# Patient Record
Sex: Male | Born: 1945
Health system: Southern US, Community
[De-identification: ages and names within clinical notes are randomized; demographics above are authoritative.]

## PROBLEM LIST (undated history)

## (undated) DIAGNOSIS — T4145XA Adverse effect of unspecified anesthetic, initial encounter: Secondary | ICD-10-CM

## (undated) DIAGNOSIS — I70213 Atherosclerosis of native arteries of extremities with intermittent claudication, bilateral legs: Secondary | ICD-10-CM

## (undated) DIAGNOSIS — K219 Gastro-esophageal reflux disease without esophagitis: Secondary | ICD-10-CM

## (undated) DIAGNOSIS — I2510 Atherosclerotic heart disease of native coronary artery without angina pectoris: Secondary | ICD-10-CM

## (undated) DIAGNOSIS — T8859XA Other complications of anesthesia, initial encounter: Secondary | ICD-10-CM

## (undated) DIAGNOSIS — Z720 Tobacco use: Secondary | ICD-10-CM

## (undated) DIAGNOSIS — I420 Dilated cardiomyopathy: Secondary | ICD-10-CM

## (undated) DIAGNOSIS — I209 Angina pectoris, unspecified: Secondary | ICD-10-CM

## (undated) DIAGNOSIS — J189 Pneumonia, unspecified organism: Secondary | ICD-10-CM

## (undated) DIAGNOSIS — I10 Essential (primary) hypertension: Secondary | ICD-10-CM

## (undated) DIAGNOSIS — R51 Headache: Secondary | ICD-10-CM

## (undated) DIAGNOSIS — R519 Headache, unspecified: Secondary | ICD-10-CM

## (undated) DIAGNOSIS — M1990 Unspecified osteoarthritis, unspecified site: Secondary | ICD-10-CM

## (undated) DIAGNOSIS — Z8709 Personal history of other diseases of the respiratory system: Secondary | ICD-10-CM

## (undated) DIAGNOSIS — E7800 Pure hypercholesterolemia, unspecified: Secondary | ICD-10-CM

## (undated) DIAGNOSIS — R0602 Shortness of breath: Secondary | ICD-10-CM

## (undated) DIAGNOSIS — E119 Type 2 diabetes mellitus without complications: Secondary | ICD-10-CM

## (undated) HISTORY — DX: Tobacco use: Z72.0

## (undated) HISTORY — PX: EXPLORATORY LAPAROTOMY: SUR591

## (undated) HISTORY — DX: Dilated cardiomyopathy: I42.0

## (undated) HISTORY — PX: CARDIAC CATHETERIZATION: SHX172

## (undated) HISTORY — PX: CHOLECYSTECTOMY: SHX55

## (undated) HISTORY — DX: Shortness of breath: R06.02

## (undated) HISTORY — DX: Atherosclerosis of native arteries of extremities with intermittent claudication, bilateral legs: I70.213

## (undated) HISTORY — PX: HERNIA REPAIR: SHX51

## (undated) HISTORY — PX: JOINT REPLACEMENT: SHX530

## (undated) HISTORY — PX: APPENDECTOMY: SHX54

## (undated) HISTORY — PX: TOTAL KNEE ARTHROPLASTY: SHX125

---

## 1966-03-23 DIAGNOSIS — Z8709 Personal history of other diseases of the respiratory system: Secondary | ICD-10-CM

## 1966-03-23 HISTORY — DX: Personal history of other diseases of the respiratory system: Z87.09

## 2001-03-23 HISTORY — PX: CORONARY ARTERY BYPASS GRAFT: SHX141

## 2001-03-31 ENCOUNTER — Encounter: Payer: Self-pay | Admitting: Thoracic Surgery (Cardiothoracic Vascular Surgery)

## 2001-04-01 ENCOUNTER — Encounter: Payer: Self-pay | Admitting: Thoracic Surgery (Cardiothoracic Vascular Surgery)

## 2001-04-01 ENCOUNTER — Inpatient Hospital Stay (HOSPITAL_COMMUNITY): Admission: AD | Admit: 2001-04-01 | Discharge: 2001-04-06 | Payer: Self-pay | Admitting: *Deleted

## 2001-04-02 ENCOUNTER — Encounter: Payer: Self-pay | Admitting: Thoracic Surgery (Cardiothoracic Vascular Surgery)

## 2001-04-03 ENCOUNTER — Encounter: Payer: Self-pay | Admitting: Thoracic Surgery (Cardiothoracic Vascular Surgery)

## 2004-06-09 ENCOUNTER — Ambulatory Visit: Payer: Self-pay | Admitting: Family Medicine

## 2004-12-12 ENCOUNTER — Ambulatory Visit: Payer: Self-pay | Admitting: Family Medicine

## 2005-01-07 ENCOUNTER — Inpatient Hospital Stay (HOSPITAL_COMMUNITY): Admission: RE | Admit: 2005-01-07 | Discharge: 2005-01-16 | Payer: Self-pay | Admitting: Orthopedic Surgery

## 2005-01-07 ENCOUNTER — Ambulatory Visit: Payer: Self-pay | Admitting: Physical Medicine & Rehabilitation

## 2005-01-12 ENCOUNTER — Ambulatory Visit: Payer: Self-pay | Admitting: Internal Medicine

## 2005-04-20 ENCOUNTER — Ambulatory Visit: Payer: Self-pay | Admitting: Family Medicine

## 2006-09-11 IMAGING — CR DG ABDOMEN 2V
3 series · 3 of 3 positions shown · non-contrast
Comparison: 01/13/05.

CLINICAL DATA: Postoperative ileus.  Abdominal distention.  
 2-VIEW ABDOMEN:

[w abdomen upright *]
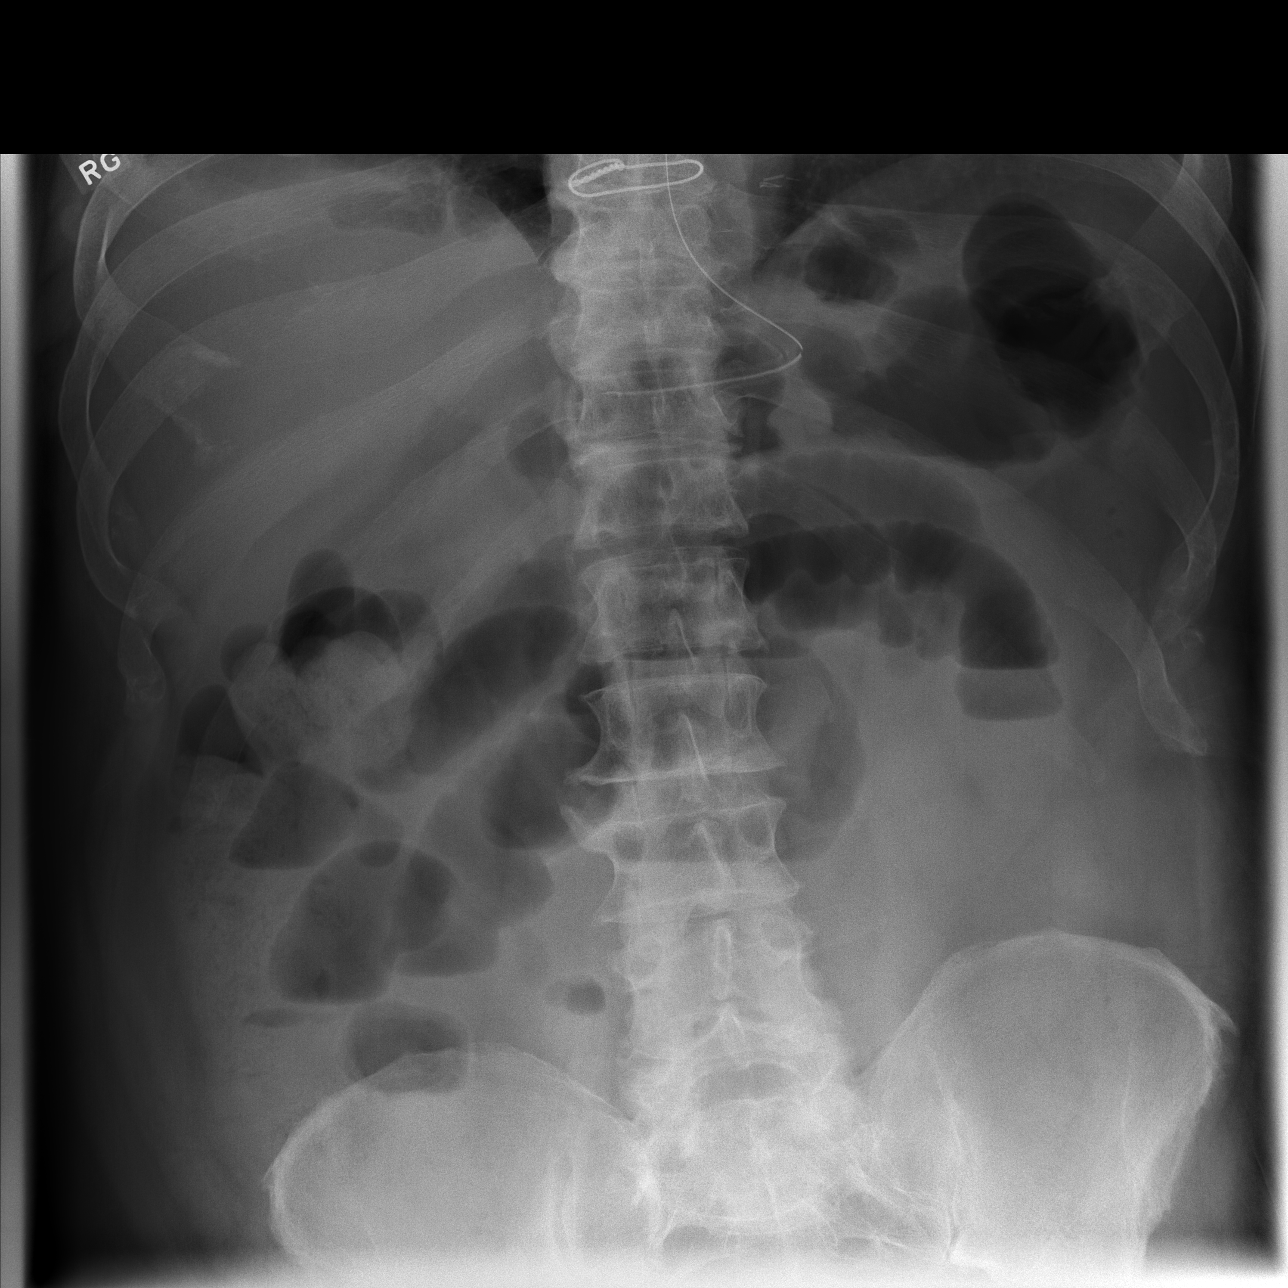

[t abdomen supine *]
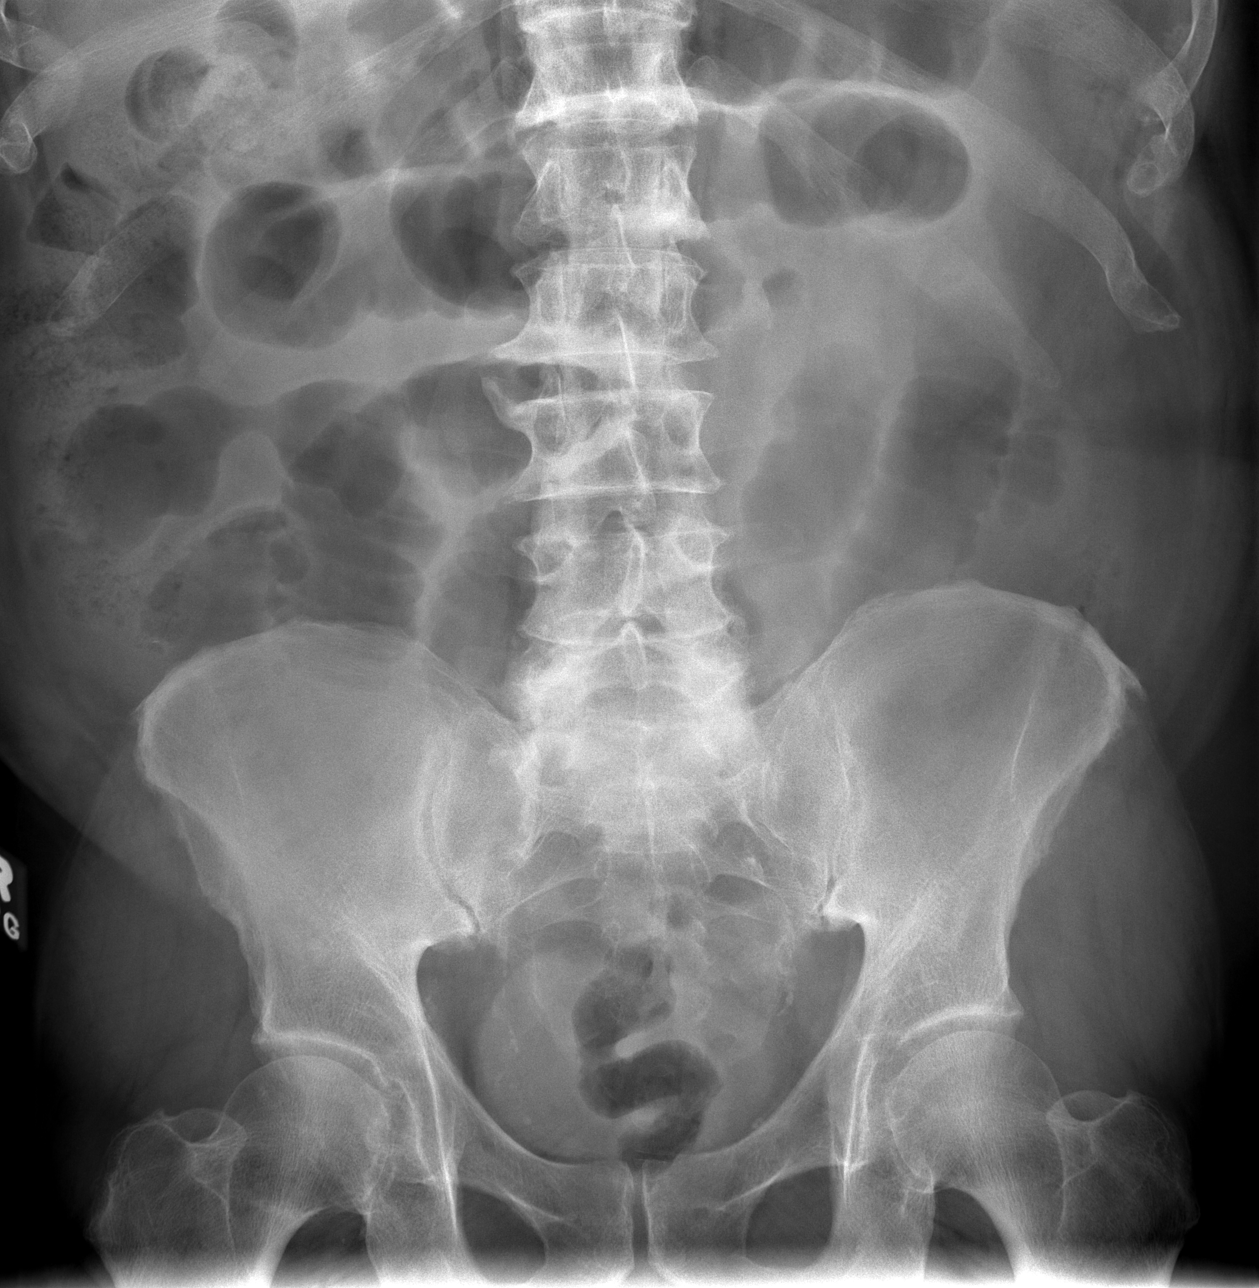

[t abdomen supine]
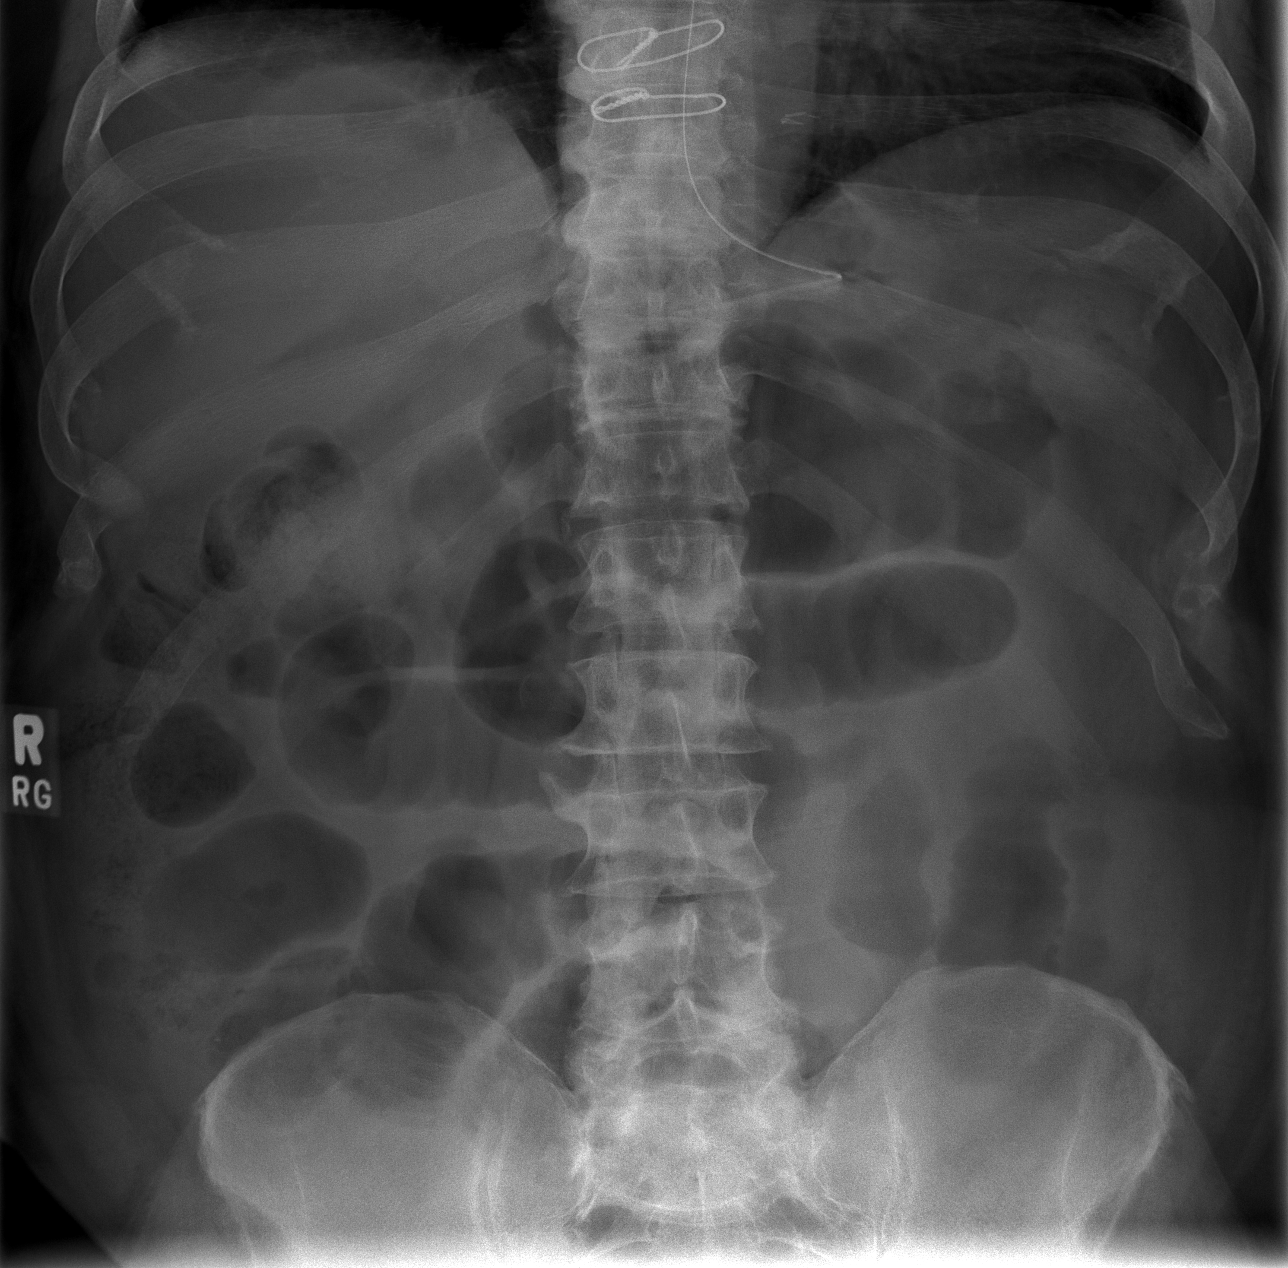

[3 of 3 positions shown; findings below may reference images not displayed]

FINDINGS: No evidence for intraperitoneal free air on the upright film.  Diffuse gaseous small bowel distention with air and stool in the right colon is again noted although the degree of gaseous small bowel dilation appears slightly decreased in the interval.  NG tube remains in place.  There is atelectasis or infiltrate at the right base with a probable tiny right effusion.
IMPRESSION: 1.  No evidence for intraperitoneal free air. 
 2.  Slight interval improvement in diffuse gaseous small bowel distention.  
 3.  Atelectasis or infiltrate in the right base with a small right pleural effusion.

## 2006-09-12 IMAGING — CR DG ABDOMEN 1V
2 series · 2 of 2 positions shown · non-contrast
Comparison: Abdomen x-rays yesterday and 01/13/2005.

CLINICAL DATA: Followup ileus.

ABDOMEN -  AP VIEW  01/15/2005:

[view not recorded (1 of 2)]
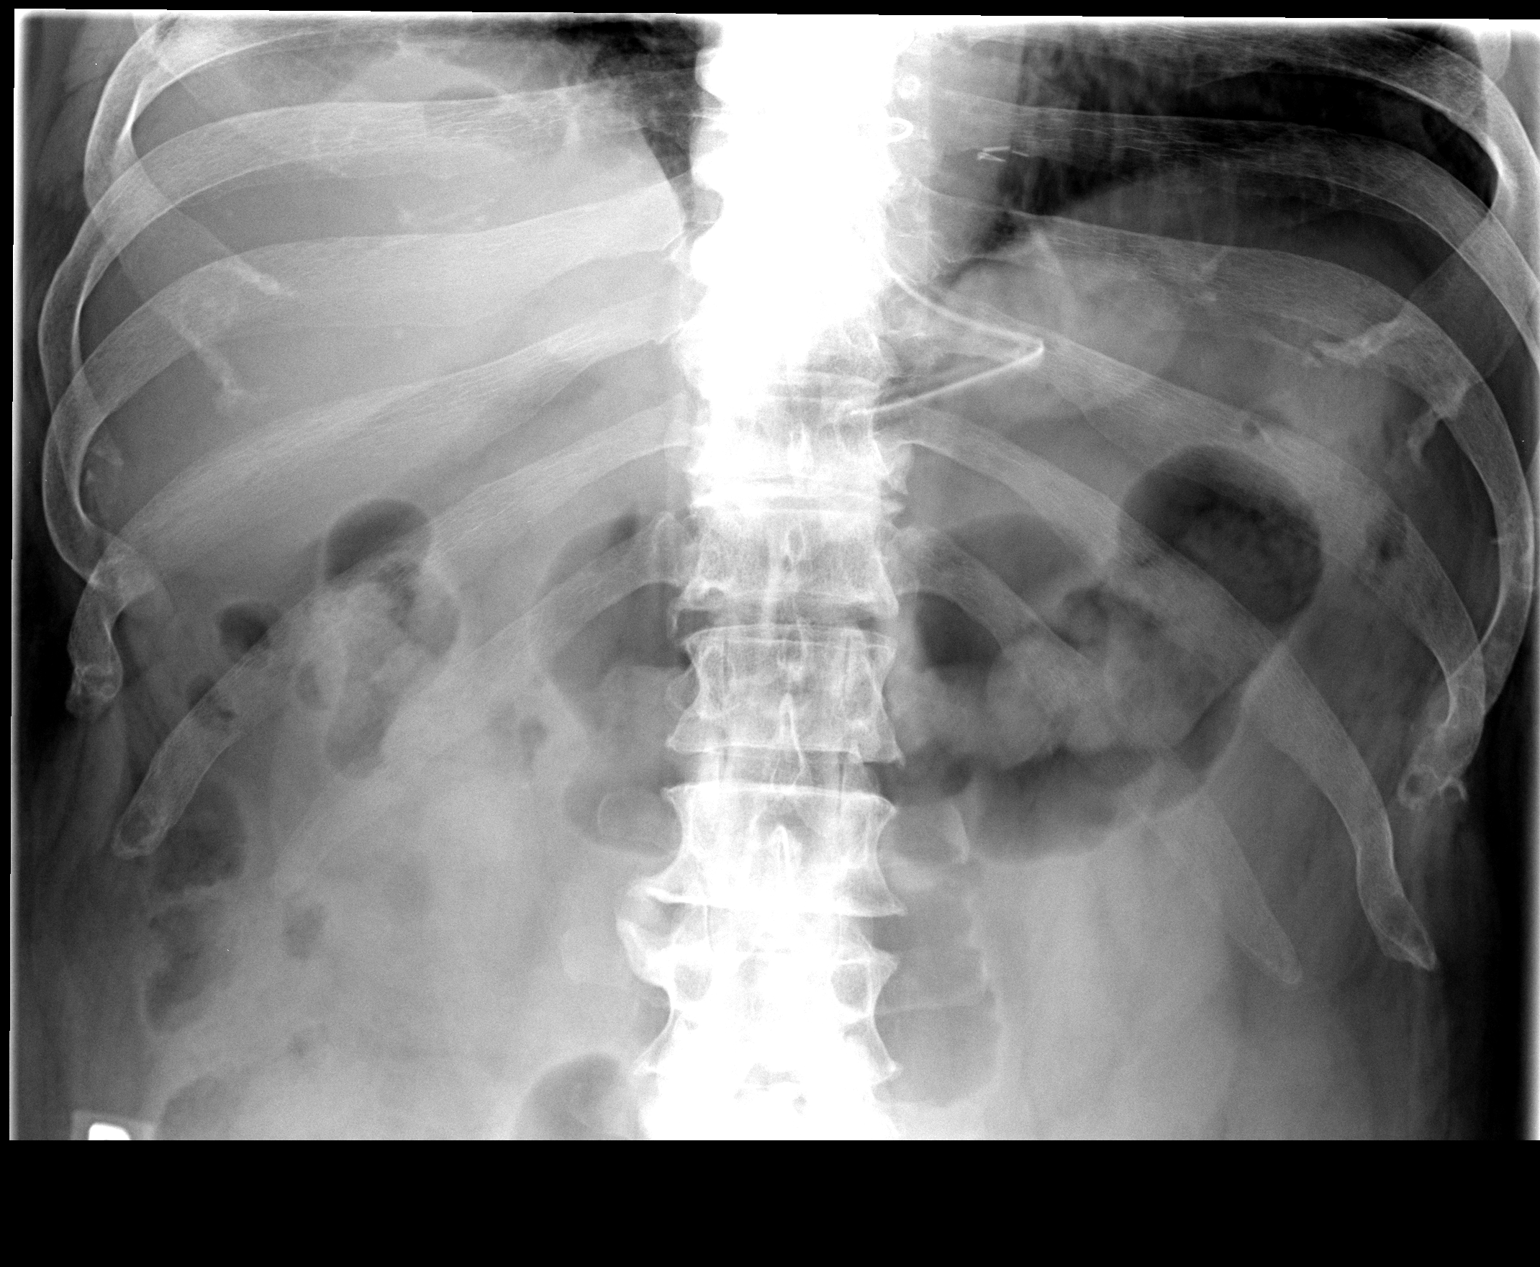

[view not recorded (2 of 2)]
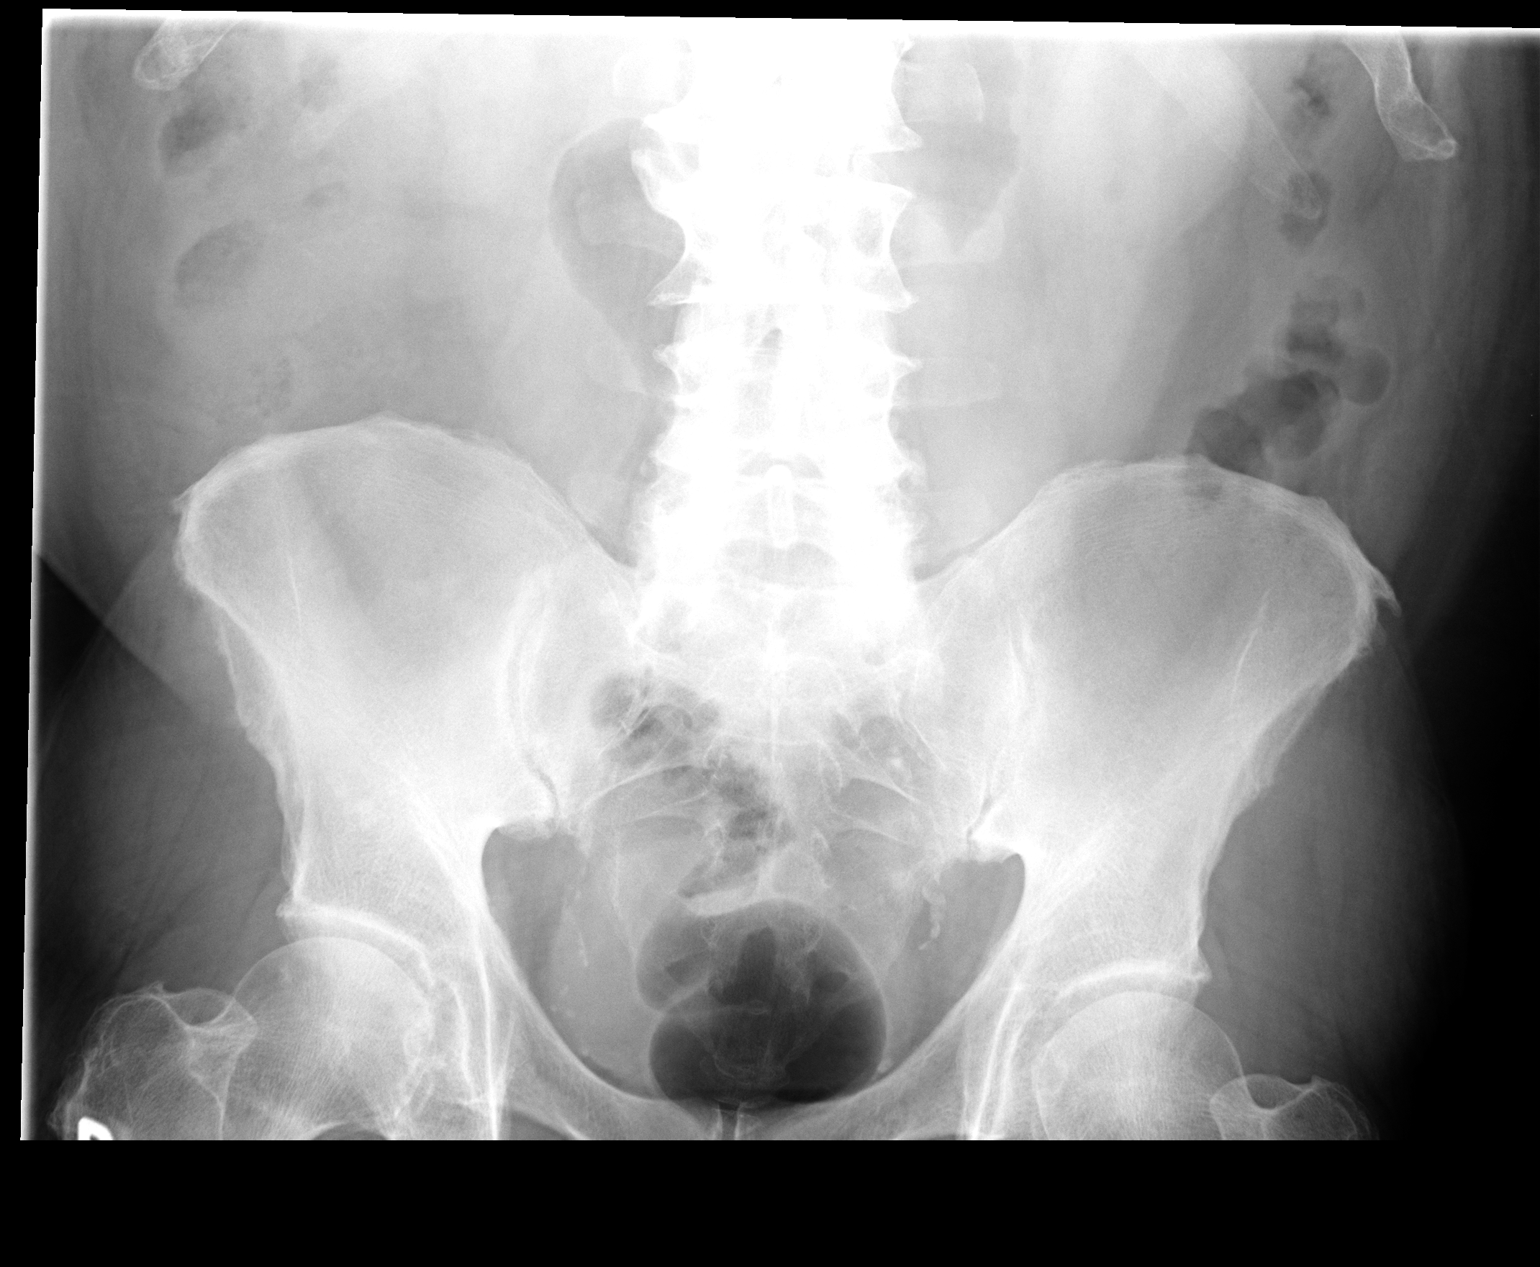

[2 of 2 positions shown; findings below may reference images not displayed]

FINDINGS: The previously identified distended small bowel and colon has
essentially resolved, with only 1 or 2 loops of minimally dilated small bowel
persisting. Nasogastric tube is in the stomach. There is no evidence of free
air. Atherosclerotic calcification in the iliac arteries is again noted. There
are degenerative changes in the lumbar spine.
IMPRESSION: Near-complete resolution of the adynamic ileus.

## 2009-07-03 ENCOUNTER — Ambulatory Visit: Payer: Self-pay | Admitting: Gastroenterology

## 2009-07-03 DIAGNOSIS — I2510 Atherosclerotic heart disease of native coronary artery without angina pectoris: Secondary | ICD-10-CM | POA: Insufficient documentation

## 2009-07-03 DIAGNOSIS — K219 Gastro-esophageal reflux disease without esophagitis: Secondary | ICD-10-CM | POA: Insufficient documentation

## 2009-07-04 ENCOUNTER — Encounter (INDEPENDENT_AMBULATORY_CARE_PROVIDER_SITE_OTHER): Payer: Self-pay | Admitting: *Deleted

## 2009-09-03 ENCOUNTER — Ambulatory Visit: Payer: Self-pay | Admitting: Gastroenterology

## 2009-09-05 ENCOUNTER — Encounter: Payer: Self-pay | Admitting: Gastroenterology

## 2010-04-24 NOTE — Letter (Signed)
Summary: Patient Notice- Polyp Results  [facility]  [address] [place], [place] [address]   Phone: [phone]  Fax: [phone]        September 05, 2009 MRN: [id number]    Kruze LIPARI [address] [place], [place]  [address]    Dear Mr. Michaelsen,  I am pleased to inform you that the colon polyp(s) removed during your recent colonoscopy was (were) found to be benign (no cancer detected) upon pathologic examination.  I recommend you have a repeat colonoscopy examination in 5_ years to look for recurrent polyps, as having colon polyps increases your risk for having recurrent polyps or even colon cancer in the future.  Should you develop new or worsening symptoms of abdominal pain, bowel habit changes or bleeding from the rectum or bowels, please schedule an evaluation with either your primary care physician or with me.  Additional information/recommendations:  __ No further action with gastroenterology is needed at this time. Please      follow-up with your primary care physician for your other healthcare      needs.  __ Please call [phone] to schedule a return visit to review your      situation.  __ Please keep your follow-up visit as already scheduled.  _x_ Continue treatment plan as outlined the day of your exam.  Please call us if you are having persistent problems or have questions about your condition that have not been fully answered at this time.  Sincerely,  Louis Meckel MD  This letter has been electronically signed by your physician.  Appended Document: Patient Notice- Polyp Results letter mailed.

## 2010-04-24 NOTE — Assessment & Plan Note (Signed)
  Nurse Visit   Preventive Screening-Counseling & Management  Comments: Referred by Dr. Arlyce Dice for Pozen aspirin study.  Patient was given Informed Consent to read, but probably not interested---too many endoscopies.  Will call Harriett Sine if he changes his mind.   Allergies: No Known Drug Allergies

## 2010-04-24 NOTE — Assessment & Plan Note (Signed)
Summary: SCREEN FOR COLON/YF   History of Present Illness Visit Type: consult Primary GI MD: Melvia Heaps MD [facility] Requesting Provider: Orson Gear, MD Chief Complaint: change in bowels, pt has never had colonoscopy.  Pt also c/o belching and heartburn with spicy foods History of Present Illness:   [name] is a pleasant 65 year old white male referred at the request of Dr.Shevlin for colonoscopy.  Family history is pertinent for his father who had colon cancer.  He complains of erratic bowels consisting of episodes of constipation and diarrhea.  He has had reflux symptoms for years characterized by pyrosis which is fairly well controlled with Protonix.  He has occasional breakthrough pyrosis and excess belching.  The patient has coronary artery disease and is status post CABG.  He is on Plavix and aspirin.   GI Review of Systems    Reports belching, bloating, and  heartburn.      Denies abdominal pain, acid reflux, chest pain, dysphagia with liquids, dysphagia with solids, loss of appetite, nausea, vomiting, vomiting blood, weight loss, and  weight gain.      Reports black tarry stools, change in bowel habits, and  hemorrhoids.     Denies anal fissure, constipation, diarrhea, diverticulosis, fecal incontinence, heme positive stool, irritable bowel syndrome, jaundice, light color stool, liver problems, rectal bleeding, and  rectal pain. Preventive Screening-Counseling & Management  Alcohol-Tobacco     Smoking Status: current    Current Medications (verified): 1)  Tricor 145 Mg Tabs (Fenofibrate) .Marland Kitchen.. 1 By Mouth Once Daily 2)  Lipitor 40 Mg Tabs (Atorvastatin Calcium) .... 1/2 Tablet  By Mouth Once Daily 3)  Lisinopril 5 Mg Tabs (Lisinopril) .... Take 1 Tablet By Mouth Once A Day 4)  Toprol Xl 50 Mg Xr24h-Tab (Metoprolol Succinate) .Marland Kitchen.. 1 By Mouth Once Daily 5)  Protonix 40 Mg Tbec (Pantoprazole Sodium) .Marland Kitchen.. 1 By Mouth Once Daily 6)  Niaspan 750 Mg Cr-Tabs (Niacin  (Antihyperlipidemic)) .... Take 2 Tablets By Mouth At Bedtime 7)  Klor-Con 10 10 Meq Cr-Tabs (Potassium Chloride) .... Take 1 Tablet By Mouth Once A Day 8)  Plavix 75 Mg Tabs (Clopidogrel Bisulfate) .... Take 1 Tablet By Mouth Once A Day 9)  Fish Oil 1200 Mg Caps (Omega-3 Fatty Acids) .... Take 1 Capsule By Mouth Once A Day 10)  Furosemide 40 Mg Tabs (Furosemide) .... Take 1 1/2 Tablet By Mouth Once Daily 11)  Aspirin 325 Mg Tabs (Aspirin) .... Take 1 Tablet By Mouth Once A Day  Allergies (verified): No Known Drug Allergies  Past History:  Past Medical History: Reviewed history from 06/28/2009 and no changes required. End-Stage Osteoarthritis CAD Hypertension Obesity Hemorrhoids  Past Surgical History: Right total knee arthroplasty Coronary artery bypass graft with 3 vessel bypass 2003 Postoperative superficial cellulitis  (Leg) Angioplasty x 3 2003  Family History: Family History of Breast Cancer: Mother Family History of Colon Cancer: Father-? rectum-dx'd late 41's Family History of Heart Disease: MGF  Social History: Patient currently smokes.  Alcohol Use - no Daily Caffeine Use 5/day Smoking Status:  current  Review of Systems       The patient complains of sleeping problems and swelling of feet/legs.  The patient denies allergy/sinus, anemia, anxiety-new, arthritis/joint pain, back pain, blood in urine, breast changes/lumps, confusion, cough, coughing up blood, depression-new, fainting, fatigue, fever, headaches-new, hearing problems, heart murmur, heart rhythm changes, itching, muscle pains/cramps, night sweats, nosebleeds, shortness of breath, skin rash, sore throat, swollen lymph glands, thirst - excessive, urination - excessive, urination changes/pain,  urine leakage, vision changes, and voice change.         All other systems were reviewed and were negative   Vital Signs:  Patient profile:   65 year old male Height:      68 inches Weight:      277 pounds BMI:      42.27 Pulse rate:   60 / minute Pulse rhythm:   regular BP sitting:   140 / 64  (left arm) Cuff size:   large  Vitals Entered By: Francee Piccolo CMA Duncan Dull) (July 03, 2009 10:23 AM)  Physical Exam  Additional Exam:  On physical exam he is heavyset male  skin: anicteric HEENT: normocephalic; PEERLA; no nasal or pharyngeal abnormalities neck: supple nodes: no cervical lymphadenopathy chest: clear to ausculatation and percussion heart: no murmurs, gallops, or rubs abd: soft, nontender; BS normoactive; no abdominal masses, tenderness, organomegaly; there is a small reducible umbilical hernia rectal: deferred ext: no cynanosis, clubbing; her chronic venous stasis changes in his lower extremities.  He has mild erythema of his right lower extremity skeletal: no deformities neuro: oriented x 3; no focal abnormalities    Impression & Recommendations:  Problem # 1:  FM HX MALIGNANT NEOPLASM GASTROINTESTINAL TRACT (ICD-V16.0)  Plan screening colonoscopy.  Plavix will be held and advanced to this procedure.  Risks, alternatives, and complications of the procedure, including bleeding, perforation, and possible need for surgery, were explained to the patient.  Patient's questions were answered.  Orders: Colon/Endo (Colon/Endo)  Problem # 2:  ESOPHAGEAL REFLUX (ICD-530.81)  Plan to do an upper endoscopy at the same time of his colonoscopy to rule out Barrett's esophagus  Orders: Colon/Endo (Colon/Endo)  Problem # 3:  CAD (ICD-414.00) Assessment: Comment Only  Patient Instructions: 1)  Colonoscopy and Flexible Sigmoidoscopy brochure given.  2)  Conscious Sedation brochure given.  3)  Upper Endoscopy brochure given.  4)  Copy Sent to: Orson Gear, MD 5)  The medication list was reviewed and reconciled.  All changed / newly prescribed medications were explained.  A complete medication list was provided to the patient / caregiver. Prescriptions: MOVIPREP 100 GM  SOLR  (PEG-KCL-NACL-NASULF-NA ASC-C) As per prep instructions.  #1 x 0   Entered by:   Hortense Ramal CMA (AAMA)   Authorized by:   Louis Meckel MD   Signed by:   Hortense Ramal CMA (AAMA) on 07/03/2009   Method used:   Electronically to        [facility]  [address] [phone]* (retail)       [address]       [place], [place]  [address]       Ph: [phone] or [phone]       Fax: [phone]   RxID:   [id number]

## 2010-04-24 NOTE — Letter (Signed)
Summary: [facility] Instructions  [facility]  [address] [place], [place] [address]   Phone: [phone]  Fax: [phone]       Tony King    [birth year]    MRN: [id number]        Procedure Day /Date: 08/01/09 Thursday     Arrival Time: 8:30 am     Procedure Time: 9:30 am     Location of Procedure:                    _x _  [facility] (4th Floor)                   PREPARATION FOR COLONOSCOPY WITH MOVIPREP   Starting 5 days prior to your procedure (07/27/09) do not eat nuts, seeds, popcorn, corn, beans, peas,  salads, or any raw vegetables.  Do not take any fiber supplements (e.g. Metamucil, Citrucel, and Benefiber).  THE DAY BEFORE YOUR PROCEDURE         DATE: 07/31/09  DAY: Wednesday  1.  Drink clear liquids the entire day-NO SOLID FOOD  2.  Do not drink anything colored red or purple.  Avoid juices with pulp.  No orange juice.  3.  Drink at least 64 oz. (8 glasses) of fluid/clear liquids during the day to prevent dehydration and help the prep work efficiently.  CLEAR LIQUIDS INCLUDE: Water Jello Ice Popsicles Tea (sugar ok, no milk/cream) Powdered fruit flavored drinks Coffee (sugar ok, no milk/cream) Gatorade Juice: apple, white grape, white cranberry  Lemonade Clear bullion, consomm, broth Carbonated beverages (any kind) Strained chicken noodle soup Hard Candy                             4.  In the morning, mix first dose of MoviPrep solution:    Empty 1 Pouch A and 1 Pouch B into the disposable container    Add lukewarm drinking water to the top line of the container. Mix to dissolve    Refrigerate (mixed solution should be used within 24 hrs)  5.  Begin drinking the prep at 5:00 p.m. The MoviPrep container is divided by 4 marks.   Every 15 minutes drink the solution down to the next mark (approximately 8 oz) until the full liter is complete.   6.  Follow completed prep with 16 oz of clear liquid of your choice (Nothing  red or purple).  Continue to drink clear liquids until bedtime.  7.  Before going to bed, mix second dose of MoviPrep solution:    Empty 1 Pouch A and 1 Pouch B into the disposable container    Add lukewarm drinking water to the top line of the container. Mix to dissolve    Refrigerate  THE DAY OF YOUR PROCEDURE      DATE: 08/01/09 DAY: Thursday  Beginning at 4:30 a.m. (5 hours before procedure):         1. Every 15 minutes, drink the solution down to the next mark (approx 8 oz) until the full liter is complete.  2. Follow completed prep with 16 oz. of clear liquid of your choice.    3. You may drink clear liquids until 7:30 am (2 HOURS BEFORE PROCEDURE).   MEDICATION INSTRUCTIONS  Unless otherwise instructed, you should take regular prescription medications with a small sip of water   as early as possible the morning of your procedure.   Stop  taking Plavix or Aggrenox on  07/26/09  (7 days before procedure) per Dr Melvia Heaps.           OTHER INSTRUCTIONS  You will need a responsible adult at least 66 years of age to accompany you and drive you home.   This person must remain in the waiting room during your procedure.  Wear loose fitting clothing that is easily removed.  Leave jewelry and other valuables at home.  However, you may wish to bring a book to read or  an iPod/MP3 player to listen to music as you wait for your procedure to start.  Remove all body piercing jewelry and leave at home.  Total time from sign-in until discharge is approximately 2-3 hours.  You should go home directly after your procedure and rest.  You can resume normal activities the  day after your procedure.       The day of your procedure you should not:   Drive   Make legal decisions   Operate machinery   Drink alcohol   Return to work  You will receive specific instructions about eating, activities and medications before you leave.    The above instructions have been  reviewed and explained to me by  Tony King CMA Duncan Dull)  July 03, 2009 11:03 AM     I fully understand and can verbalize these instructions _____________________________ Date 07/02/09

## 2010-04-24 NOTE — Letter (Signed)
Summary: Results Letter  [facility]  [address] [place], [place] [address]   Phone: [phone]  Fax: [phone]        July 03, 2009 MRN: [id number]    Juwon ANTILLON [address] [place], [place]  [address]    Dear Mr. Frerichs,  It is my pleasure to have treated you recently as a new patient in my office. I appreciate your confidence and the opportunity to participate in your care.  Since I do have a busy inpatient endoscopy schedule and office schedule, my office hours vary weekly. I am, however, available for emergency calls everyday through my office. If I am not available for an urgent office appointment, another one of our gastroenterologist will be able to assist you.  My well-trained staff are prepared to help you at all times. For emergencies after office hours, a physician from our Gastroenterology section is always available through my 24 hour answering service  Once again I welcome you as a new patient and I look forward to a happy and healthy relationship             Sincerely,  Louis Meckel MD  This letter has been electronically signed by your physician.  Appended Document: Results Letter Letter mailed to patient.

## 2010-04-24 NOTE — Procedures (Signed)
Summary: Colonoscopy  Patient: Tony King Note: All result statuses are Final unless otherwise noted.  Tests: (1) Colonoscopy (COL)   COL Colonoscopy           DONE     [facility]     [address]     [place], [place]  [address]           COLONOSCOPY PROCEDURE REPORT           PATIENT:  Claudy, Abdallah  MR#:  [id number]     BIRTHDATE:  1945-04-22, 63 yrs. old  GENDER:  male           ENDOSCOPIST:  Barbette Hair. Arlyce Dice, MD     Referred by:           PROCEDURE DATE:  09/03/2009     PROCEDURE:  Colon with cold biopsy polypectomy, Colonoscopy with     snare polypectomy     ASA CLASS:  Class II     INDICATIONS:  1) Elevated Risk Screening  2) family history of     colon cancer Father           MEDICATIONS:   Fentanyl 75 mcg IV, Versed 9 mg IV           DESCRIPTION OF PROCEDURE:   After the risks benefits and     alternatives of the procedure were thoroughly explained, informed     consent was obtained.  Digital rectal exam was performed and     revealed no abnormalities.   The LB CF-H180AL [id number] endoscope     was introduced through the anus and advanced to the cecum, which     was identified by both the appendix and ileocecal valve, without     limitations.  The quality of the prep was excellent, using     MoviPrep.  The instrument was then slowly withdrawn as the colon     was fully examined.     <<PROCEDUREIMAGES>>           FINDINGS:  There were multiple polyps identified and removed (see     image3, image5, and image6). 3 2-84mm polyps in cecum, asc colon     and proximal transverse colon - removed with cold bx forceps, and     cold snare, respectively  A sessile polyp was found in the rectum.     It was 3 mm in size. It was found 2 cm from the point of entry.     Polyp was snared without cautery. Retrieval was successful (see     image12). snare polyp  Moderate diverticulosis was found in the     sigmoid colon (see image1).  This was otherwise a normal  examination of the colon (see image2, image7, image9, image11, and     image13).   Retroflexed views in the rectum revealed no     abnormalities.    The time to cecum =  3.5  minutes. The scope was     then withdrawn (time =  10.75  min) from the patient and the     procedure completed.           COMPLICATIONS:  None           ENDOSCOPIC IMPRESSION:     1) Polyps, multiple - proximal colon     2) 3 mm sessile polyp in the rectum     3) Moderate diverticulosis in the sigmoid colon     4) Otherwise normal  examination     RECOMMENDATIONS:     1) Given your significant family history of colon cancer, you     should have a repeat colonoscopy in 5 years     2) resume plavix in 7 days           REPEAT EXAM:  In 5 year(s) for Colonoscopy.           ______________________________     Barbette Hair. Arlyce Dice, MD           CC: Caralyn Guile, MD           n.     Rosalie DoctorBarbette Hair. Latiesha Harada at 09/03/2009 10:15 AM           Page 2 of 3   Alper, [place], [id number]  Note: An exclamation mark (!) indicates a result that was not dispersed into the flowsheet. Document Creation Date: 09/03/2009 10:16 AM _______________________________________________________________________  (1) Order result status: Final Collection or observation date-time: 09/03/2009 10:01 Requested date-time:  Receipt date-time:  Reported date-time:  Referring Physician:   Ordering Physician: Melvia Heaps [phone]) Specimen Source:  Source: Launa Grill Order Number: [id number] Lab site:   Appended Document: Colonoscopy     Procedures Next Due Date:    Colonoscopy: 08/2014

## 2010-04-24 NOTE — Procedures (Signed)
Summary: Upper Endoscopy  Patient: Tony King Note: All result statuses are Final unless otherwise noted.  Tests: (1) Upper Endoscopy (EGD)   EGD Upper Endoscopy       DONE (C)     [facility]     [address]     [place], [place]  [address]           ENDOSCOPY PROCEDURE REPORT           PATIENT:  Tony King  MR#:  [id number]     BIRTHDATE:  1945-05-02, 63 yrs. old  GENDER:  male           ENDOSCOPIST:  Barbette Hair. Arlyce Dice, MD     Referred by:           PROCEDURE DATE:  09/03/2009     PROCEDURE:  EGD, diagnostic     ASA CLASS:  Class II     INDICATIONS:  GERD r/o Barrett's esophagus           MEDICATIONS:   There was residual sedation effect present from     prior procedure., glycopyrrolate (Robinal) 0.2 mg IV, 0.6cc     simethancone 0.6 cc PO     TOPICAL ANESTHETIC:  Exactacain Spray           DESCRIPTION OF PROCEDURE:   After the risks benefits and     alternatives of the procedure were thoroughly explained, informed     consent was obtained.  The LB GIF-H180 [id number] endoscope was     introduced through the mouth and advanced to the third portion of     the duodenum, without limitations.  The instrument was slowly     withdrawn as the mucosa was fully examined.     <<PROCEDUREIMAGES>>           The upper, middle, and distal third of the esophagus were     carefully inspected and no abnormalities were noted. The z-line     was well seen at the GEJ. The endoscope was pushed into the fundus     which was normal including a retroflexed view. The antrum,gastric     body, first and second part of the duodenum were unremarkable (see     image1).    Retroflexed views revealed no abnormalities.    The     scope was then withdrawn from the patient and the procedure     completed.           COMPLICATIONS:  None           ENDOSCOPIC IMPRESSION:     1) Normal EGD     RECOMMENDATIONS:     1) continue PPI           REPEAT EXAM:  No        ______________________________     Barbette Hair. Arlyce Dice, MD           CC:  Caralyn Guile, MD           n.     REVISED:  09/16/2009 09:35 AM     eSIGNED:   Barbette Hair. Kaplan at 09/16/2009 09:35 AM           Carlynn Herald, [id number]  Note: An exclamation mark (!) indicates a result that was not dispersed into the flowsheet. Document Creation Date: 09/16/2009 9:36 AM _______________________________________________________________________  (1) Order result status: Final Collection or observation date-time: 09/03/2009 10:07 Requested date-time:  Receipt date-time:  Reported date-time:  Referring  Physician:   Ordering Physician: Melvia Heaps [phone]) Specimen Source:  Source: Launa Grill Order Number: [id number] Lab site:

## 2010-08-08 NOTE — Discharge Summary (Signed)
NAMEJERMEL, Tony King NO.:  [id number]   MEDICAL RECORD NO.:  [id number]          PATIENT TYPE:  INP   LOCATION:  1509                         FACILITY:  [facility]   PHYSICIAN:  Georges Lynch. Gioffre, M.D.DATE OF BIRTH:  1946/01/18   DATE OF ADMISSION:  01/07/2005  DATE OF DISCHARGE:  01/16/2005                                 DISCHARGE SUMMARY   ADMISSION DIAGNOSES:  1.  End-stage osteoarthritis, right knee, bone on bone, medial compartment.  2.  History of coronary artery bypass graft with three vessel bypass.  3.  History of hypertension.  4.  History of obesity.  5.  History of hemorrhoids.   DISCHARGE DIAGNOSES:  1.  Right total knee arthroplasty.  2.  Postoperative nausea and vomiting with ileus, requiring gastrointestinal      consult, nasogastric tube placement, resolved.  3.  Slow progress with physical therapy.  4.  History of coronary artery bypass graft with three vessel bypass.  5.  Hypertension.  6.  Obesity.  7.  History of hemorrhoids.  8.  Postoperative superficial cellulitis, leg, resolved.   HISTORY OF PRESENT ILLNESS:  Patient is a 65 year old white male with a  history of right knee problems for many years.  He had an arthroscopy done  about 15-20 years ago with significant improvement, but over the last one  year, it has significantly worsened with the amount of discomfort and  general abilities to ambulate and go through range of motion of the knee.  He does have clicking, popping, and swelling in the knee.  X-rays reveal  bone on bone medial compartment with large osteophytes throughout.   ALLERGIES:  No known drug allergies.   CURRENT MEDICATIONS:  1.  Tricor 145 mg daily.  2.  Lipitor 40 mg daily.  3.  Altace 5 mg daily.  4.  Toprol XL 50 mg daily.   SURGICAL PROCEDURE:  On January 07, 2005, the patient was taken to the OR by  Dr. Ranee Gosselin and assisted by Oneida Alar, PA-C.  Under general  anesthesia, the patient underwent a  right total knee arthroplasty with a  DePuy system with the following components implanted:  A size 4 right  femoral component, a size 5 keeled tibial tray, 35 mg patella three peg  component, a size 4 12.5 polyethylene bearing.  The components were  implanted with polymethylmethacrylate.  Patient was transferred to the  recovery room and then to the orthopedic floor with IV pain medicines,  antibiotics, and started on heparin and Coumadin for DVT prophylaxis.   CONSULTS:  1.  The following routine consults were requested:  Physical therapy,      pharmacy for Coumadin management.  2.  Rehab consult was requested.  3.  GI consult was requested for evaluation of the patient's ileus and      management of such.   HOSPITAL COURSE:  On January 07, 2005, the patient was admitted to [facility] under the care of Dr. Ranee Gosselin.  Patient was taken to  the OR, where right knee total knee arthroplasty was performed without any  complications.  He was transferred to the recovery room, to the orthopedic  floor in good condition on Coumadin therapy for DVT prophylaxis and subcu  heparin.  The patient was also on IV antibiotics and pain medicines.  He did  very well for the first 24-48 hours and was started on CPM his first postop  day.  The patient's hemoglobin remained stable.  He had no complaints from  that issue.  His leg remained neurovascularly intact.  His wound was  initially benign for any signs of infection.  About postop day 3 or 4, he  started noticing some redness or early ecchymosis about the leg, so he  started treatment for early cellulitis of that leg with IV antibiotics.  This did improve over several days, the patient was progressing very slowly  with physical therapy.  He started complaining of some fullness and  discomfort in his abdomen.  He also started to develop some issues with  nausea.  X-ray evaluation did show x-ray findings consistent with a small  ileus.   So he was placed n.p.o.  A GI consult was requested.  He was placed  back on an IV with hydration and medication management.  Over the next 24  hours, the patient did well but then started vomiting dark green and foul-  smelling bile.  An NG was placed, and within 24-36 hours, the patient was  feeling significantly improved.  He indicated like he had a whole lot of  injury, and he could get up out of bed and proceed with his therapy, which  he did very well over the next several days.  The patient's NG tube remained  in place with continued improvement of bowel sounds and findings on his  abdominal x-rays.  It was removed on the day prior to discharge.  The  patient was able to transition from clear liquids to full foods without any  issues.  So he was allowed to be discharged home on October 26th with  arrangements made for outpatient home health physical therapy.   LABS:  CBC on October 22nd:  WBC is 9.2, down from 12.1 the day previous.  Hemoglobin 11.7, hematocrit 34.2, platelets 272.  INR on October 27th was  2.6.  His INR raised to 4.6 on the 23rd and had his Coumadin held until the  27th, when it was 2.6.  Routine chemistries on October 23rd:  Sodium 133,  potassium 3.9.  Routine urinalysis on October 10th was normal.   Venous Doppler on October 20th showed no obvious evidence of DVT,  superficial thrombus, or baker cyst bilaterally.   Chest x-ray on admission showed no acute abnormalities.   Portable abdominal x-ray on October 23 showed mild postoperative small bowel  ileus.   On October 24, abdominal x-ray shows generalized ileus with little change  from previous study.  Air/fluid level in the stomach.  No free air.   Abdominal films on the 25th show no evidence of intraperitoneal free air,  slight interval improvement of diffuse gaseous small bowel distention. Atelectasis or infiltrate in the right base, small right pleural effusion.   X-ray of abdomen on the 26th shows  near complete resolution of adynamic  ileus.   DISCHARGE MEDICATIONS:  1.  Trinsicon 1 tablet p.o. t.i.d.  2.  Colace 100 mg p.o. b.i.d.  3.  Laxative or enema of choice p.r.n.  4.  Heparin subcu, then to be DC'd.  5.  Tylenol 650 mg p.o. q.4h. p.r.n.  6.  Robaxin 500 mg p.o. q.6h. p.r.n.  7.  Reglan 10 mg IV q.6h.  8.  Altace 5 mg daily.  9.  Tricor 145 mg daily.  10. Lipitor 40 mg daily.  11. Toprol XL 50 mg daily.  12. Cepacol throat lozenges p.r.n.  13. Keflex 500 mg p.o. q.i.d.  14. Protonix 40 mg daily.   DISCHARGE INSTRUCTIONS:  1.  Diet:  Low salt, residue diet until fully recovered.  2.  Activity:  No restrictions with the use of a walker.  3.  Wound care:  Patient is to change dressing daily.   MEDICATIONS:  1.  Medications as dispensed previously, prior to hospitalization with the      addition of Robaxin 500 mg 1 tablet every 6 hours for muscle spasms if      needed.  2.  Percocet 5 mg 1-2 tablets q.4-6h. for pain if needed.  3.  Coumadin 5 mg tablet 1/2 tablet daily until changed by [facility].  4.  Colace 100 mg 1 tablet twice daily.   FOLLOW UP:  1.  Patient needs a follow-up appointment with Dr. Darrelyn Hillock 1-1/2 weeks from      discharge from hospital.  Patient is to call [phone] to make      appointment.  2.  [facility] physical therapy and RN per routine protocol.  The      RN is to remove staples on the Wednesday prior from discharge.   Patient's condition upon discharge from [facility] is improved and  good.    Patient needs a      Jamelle Rushing, P.A.    ______________________________  Georges Lynch Darrelyn Hillock, M.D.    RWK/MEDQ  D:  01/21/2005  T:  01/21/2005  Job:  [id number]

## 2010-08-08 NOTE — H&P (Signed)
[place]. [facility]  Patient:    JEFFREE, CAZEAU Visit Number: [id number] MRN: [id number]          Service Type: CAT Location: [facility] 2866 01 Attending Physician:  Veneda Melter Dictated by:   Veneda Melter, M.D. [facility] Admit Date:  03/31/2001   CC:         Luis Abed, M.D. [facility]  Elease Hashimoto A. Benedetto Goad, M.D. [facility]   History and Physical  HISTORY OF PRESENT ILLNESS:  Mr. Springborn is a 65 year old gentleman with a known history of coronary artery disease, who has previously undergo percutaneous intervention of the LAD and right coronary artery.  He presents with several episodes of throat discomfort.  Since mid December, he has had four episodes of tightening in his throat.  These occur with exertion, such as walking and yard work and have been associated with shortness of breath.  They last two to three minutes.  There is no nausea, vomiting, syncope, presyncope, or diaphoresis associated with them.  He saw Elease Hashimoto A. Benedetto Goad, M.D., in the office and further assessment was made with stress imaging study performed on March 14, 2001.  An adenosine Cardiolite showed mild left ventricular systolic dysfunction with an EF of 42%.  There was ST depression in the high lateral leads.  Imaging studies showed a small inferior scar.  He presented this morning for cardiac catheterization.  REVIEW OF SYSTEMS:  Otherwise noncontributory.  PAST MEDICAL HISTORY:  Notable for hypertension, hyperlipidemia, and coronary artery disease, status post PTCA of the LAD in May of 1994, as well as PTCA of the right coronary artery on Aug 05, 1994.  He also has arthritis and is status post left knee replacement.  ALLERGIES:  None.  MEDICATIONS: 1. Aspirin 325 mg q.d. 2. Toprol XL 50 mg q.d. 3. Norvasc 10 mg q.d. 4. Lipitor 10 mg q.d. 5. Multivitamins. 6. Advil two tablets q.4h. p.r.n. 7. Nitroglycerin as needed.  SOCIAL HISTORY:  The patient is married.  He lives with his  wife of 34 years in [place], [place].  He has a history of tobacco use of just under one pack per day for 40 years.  Denies alcohol use or drug use.  He works as a Hydrographic surveyor.  He enjoys golf and fishing.  FAMILY HISTORY:  His mother is alive at the age of 71 with borderline diabetes.  She has obesity and breast cancer.  His father is deceased at the age of 29 with bone cancer.  Two sons and one grandchild all without medical problems.  One sister, age 79, with possible heart problems and diabetes.  PHYSICAL EXAMINATION:  He is an overweight white male in no acute distress.  VITAL SIGNS:  Blood pressure 140/80, pulse 70, respirations 18.  HEENT:  The pupils equal, round, and reactive to light.  Extraocular muscles are intact.  Oropharynx with no lesions.  NECK:  Supple with no adenopathy.  Carotid upstrokes are brisk without bruits.  HEART:  Regular rate without murmurs.  LUNGS:  Clear to auscultation.  ABDOMEN:  Soft and protuberant.  No tenderness.  Good bowel sounds.  EXTREMITIES:  Peripheral pulses are 3+ and equal bilaterally.  There is no peripheral edema.  NEUROLOGIC:  Motor strength is 5/5.  Sensory is intact to touch.  LABORATORY DATA:  The sodium is 136, potassium 4.7, chloride 103, bicarbonate 28, BUN 18, creatinine 0.8, and glucose 108.  The ECG shows normal sinus rhythm with nonspecific T-wave changes in the lateral leads  at the time of his stress test.  ASSESSMENT AND PLAN:  Mr. Tallon is a 65 year old gentleman who presents with several episodes of exertional throat discomfort.  The patient is not clear of his symptoms during his prior cardiac events.  The risks, benefits, and alternatives of left heart catheterization were discussed with the patient and he elected to proceed with this.  This was performed without incident and revealed critical left main coronary artery disease.  Due to these significant findings, he will be admitted for  anticoagulation, controlled hemodynamics, and referred for surgical revascularization. Dictated by:   Veneda Melter, M.D. [facility] Attending Physician:  Veneda Melter DD:  03/31/01 TD:  03/31/01 Job: [id number]
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
## 2010-08-08 NOTE — Cardiovascular Report (Signed)
[place]. [facility]  Patient:    KC, SUMMERSON Visit Number: [id number] MRN: [id number]          Service Type: CAT Location: 4700 4705 02 Attending Physician:  Veneda Melter Dictated by:   Veneda Melter, M.D. [facility] Proc. Date: 03/31/01 Admit Date:  03/31/2001   CC:         Luis Abed, M.D. [facility]  Elease Hashimoto A. Benedetto Goad, M.D. [facility]   Cardiac Catheterization  PROCEDURES PERFORMED: 1. Left heart catheterization. 2. Left ventriculogram. 3. Selective coronary angiography. 4. Abdominal aortogram. 5. Left subclavian angiogram. 6. Perclose right femoral artery.  DIAGNOSES: 1. Left main coronary artery disease. 2. Normal left ventricular systolic function.  HISTORY:  Mr. Rodwell is a 65 year old white male with a history of coronary artery disease who has previously undergone percutaneous intervention of the right coronary artery as well as the LAD, who has done fairly well until recently when he developed chest and throat discomfort.  This occurred with exertion and was associated with mild shortness of breath.  He underwent stress imaging study on March 14, 2001, showing ejection fraction of 42%. There was a small inferior scar and ST depression in the lateral leads.  He presents now for further cardiac assessment.  TECHNIQUE: Informed consent was obtained.  The patient was brought to the catheterization laboratory.  A 6-French sheath was placed in the right femoral artery.  Left heart catheterization and selective coronary angiography were then performed using preformed 6-French Judkins catheters.  An abdominal aortogram was performed using a pigtail catheter positioned in the descending aorta and power injections of contrast.  The JR-4 catheter was placed in the left subclavian artery and nonselective opacification of internal mammary was performed.  At the termination of the case, a Perclose suture closure device was deployed to the right  femoral artery until adequate hemostasis was achieved.  The patient tolerated the procedure well and was transferred to the floor in stable condition.  FINDINGS: 1. Left main trunk:  Large caliber vessel, calcified.  There is a distal    narrowing of 80-90% involving the bifurcation. 2. Left anterior descending:  This is a medium caliber vessel that provides    a large bifurcating first diagonal branch in the proximal segment.  The LAD    tapers in its mid section.  There is moderate diffuse disease of 30% in    the LAD system. 3. Left circumflex artery:  This is a medium caliber vessel that provides two    marginal branches plus a small posteroventricular branch.  There is diffuse    disease of 30-40% in the mid AV circumflex. 4. Right coronary artery:  Dominant.  This is a medium caliber vessel that    provides a small posterior descending artery in its terminal segment.    There is mild disease of 30-40% in the mid right coronary artery. 5. Left ventricle:  Mildly dilated in systolic dimension.  Overall left    ventricular function is low-normal.  Ejection fraction is approximately    45%.  There is mild hypokinesis of the distal anterior wall.  No mitral    regurgitation is appreciated.  LV pressure is 140/5, aortic 140/80, LV EDP    equals 15. 6. Abdominal aorta is normal caliber.  There is mild atheromatous buildup    without critical stenosis or dilatation.  The iliac arteries are patent    with mild tortuosity.  There is one left renal artery which is patent and  two right renal arteries of small caliber with mild disease of 30%. 7. Left circumflex artery is patent with mild disease of 20-30% and the    internal mammary artery is of normal caliber and extends to just above the    diaphragm.  ASSESSMENT AND PLAN:  Mr. Carcione is a 65 year old gentleman with critical left main disease and mild left ventricular systolic dysfunction.  He will be referred for surgical  revascularization. Dictated by:   Veneda Melter, M.D. [facility] Attending Physician:  Veneda Melter DD:  03/31/01 TD:  03/31/01 Job: [id number]

## 2010-08-08 NOTE — Op Note (Signed)
Tony King, Tony King NO.:  [id number]   MEDICAL RECORD NO.:  [id number]          PATIENT TYPE:  INP   LOCATION:  0002                         FACILITY:  [facility]   PHYSICIAN:  Georges Lynch. Gioffre, M.D.DATE OF BIRTH:  Jul 15, 1945   DATE OF PROCEDURE:  01/07/2005  DATE OF DISCHARGE:                                 OPERATIVE REPORT   SURGEON:  Georges Lynch. Darrelyn Hillock, M.D.   ASSISTANT:  Jamelle Rushing, P.A.   PREOPERATIVE DIAGNOSIS:  Severe degenerative arthritis with a genu varus of  the right knee.   POSTOPERATIVE DIAGNOSIS:  Severe degenerative arthritis with a genu varus of  the right knee.   OPERATION:  Right total knee arthroplasty utilizing the DePuy system  rotating platform type. All three components were cemented. The sizes used  were:  The patella was a size 35, the femoral component was a size 4 right  posterior cruciate sacrificing type, the tibial tray was a size 5 and the  tibial insert was 12.5 mm thickness size 4. Vancomycin was used in the  cement.   DESCRIPTION OF PROCEDURE:  Under general anesthesia, routine orthopedic prep  and draping of the right lower extremity was carried out. The patient had 1  gram of IV Ancef. The leg was exsanguinated with an Esmarch, tourniquet was  elevated at 400 mmHg. At this time, an incision was made over the anterior  aspect of right knee, bleeders identified and cauterized. Following that,  self-retaining retractors were inserted,  two flaps were created. I then  carried out a median parapatellar incision, reflected the patella laterally,  flexed the knee. I then did medial and lateral meniscectomies and excised  the anterior and posterior cruciate ligaments. Bone spurs were removed from  the patella, tibia and femur. We made our initial drill hole in the  intercondylar notch of the femur, a #1 jig was inserted, 11 mm thickness  taken off the distal femur. Following that, the #2 jig was inserted for a  size 4 femur.  We carried out our anterior, posterior and chamfering cuts in  the usual fashion. Next, attention was directed to the tibia. We removed 4  mm thickness off the affected medial side of the tibia. A size 5 tibial tray  was finally utilized, a keel cut was made in the tibia. We then completed  our notch cut out of the distal femur. All loose pieces of bone were  removed. We then inserted our trials. We first out with a 10 mm thickness  tibial insert and then went to 12.5 which was more stable. We took the knee  through a motion and had excellent stability, excellent motion. We then  prepared the patella in the usual fashion for a size 35 patella. We utilized  a size 35 three-pronged patella, it was a resurfacing type patella. After  this, we went through range of motion again and had good stability. We did a  slight lateral release. All three components were removed. We made sure we  had no spurs posteriorly on the femur, no other loose fragments. I  thoroughly water  picked out the knee, dried the knee out and cemented all  three components in simultaneously. Vancomycin was used in the cement.  Following this, we then cemented in all three components, we made sure all  loose cement was removed at the end of the case. We then went through a  trial range of motion and again had good stability with a 12.5 mm thickness  tibial insert, so we removed the trial insert and inserted a  permanent rotating platform insert which was 12.5 mm in thickness size 4. We  then inserted a hemovac drain, water picked out the knee again and then  closed the wound in layers in the usual fashion. Sterile dressings were  applied. The patient had 1 gram of IV Ancef preop.           ______________________________  Georges Lynch. Darrelyn Hillock, M.D.     RAG/MEDQ  D:  01/07/2005  T:  01/07/2005  Job:  [id number]

## 2010-08-08 NOTE — Consult Note (Signed)
Tony, King NO.:  [id number]   MEDICAL RECORD NO.:  [id number]          PATIENT TYPE:  INP   LOCATION:  1509                         FACILITY:  [facility]   PHYSICIAN:  Iva Boop, M.D. LHCDATE OF BIRTH:  03-18-1946   DATE OF CONSULTATION:  01/12/2005  DATE OF DISCHARGE:                                   CONSULTATION   REQUESTING PHYSICIAN:  Georges Lynch. Darrelyn Hillock, M.D.   REASON FOR CONSULTATION:  Ileus.   ASSESSMENT:  Small bowel ileus after knee replacement surgery.   PLAN:  1.  IV Reglan.  2.  Mobilization.  3.  Agree with discontinuation of narcotics as you have done.  4.  Check labs and replete electrolytes as needed.  5.  Follow up x-rays in the morning.   HISTORY:  This is a pleasant 65 year old white male who underwent right knee  replacement five days prior to this consultation.  He was doing well but  developed increasing abdominal bloating, and that persisted.  He has not  really been able to eat well.  He has had some dry heaves and can keep some  fluids down.  He had an enema and two small bowel movements after that and  then a small bowel movement yesterday with a suppository.  He is passing  little bits of flatus, and he had severe cramps overnight, although  yesterday was his first day or night without narcotic pain medication.  He  is not in any significant distress at this time.  Abdominal x-rays have  shown mildly dilated small bowel loops with normal caliber colon and  stomach.   MEDICATIONS:  1.  Docusate 100 mg twice daily.  2.  Trinsicon capsule 3 times a day.  3.  Warfarin per protocol.  4.  Altace 5 mg daily.  5.  Tricor 145 mg daily.  6.  Lipitor 40 mg every evening.  7.  Metoprolol 50 mg daily.  8.  Protonix 40 mg daily.  9.  Keflex 500 mg 4 times a day.   DRUG ALLERGIES:  None known.   PAST MEDICAL HISTORY:  1.  Left total knee replacement in 2000.  2.  Coronary artery bypass grafting in 2004.  3.   Hypertension.  4.  Dyslipidemia.  5.  Remote appendectomy.  6.  Left knee replacement remotely, as mentioned.   REVIEW OF SYSTEMS:  He is having some postoperative knee pain.  No chest  pain or respiratory difficulty reported.   PHYSICAL EXAMINATION:  VITAL SIGNS:  Temperature 97.1, blood pressure  173/84, pulse 83.  GENERAL:  A well-developed and well-nourished white male in no acute  distress.  HEENT:  Eyes anicteric.  LUNGS:  Clear.  HEART:  S1 and S2.  No rubs or gallops.  ABDOMEN:  Distended.  Protuberant.  Soft.  Bowel sounds present and high  pitched.  Mildly tender in the upper abdomen without organomegaly or mass.  No rebound.   LAB DATA:  INR is 3.  White count 9.2 yesterday.  Hemoglobin 11.7.  INR 4.6  today.   X-RAY:  As above.  ADDENDUM:  If he has not had a screening colonoscopy, that would be  appropriate at some point.  We can discuss later. He has a primary care in  [place].  Would defer to them at some point unless he chooses to follow up  with [place], although that should not be necessarily related to this problem.   I appreciate the opportunity to care for this patient.      Iva Boop, M.D. [facility]  Electronically Signed     CEG/MEDQ  D:  01/12/2005  T:  01/12/2005  Job:  [id number]   cc:   Georges Lynch. Darrelyn Hillock, M.D.  Fax: [phone]   Gilford Rile. Benedetto Goad, M.D.  Fax: [phone]

## 2010-08-08 NOTE — H&P (Signed)
[name], King NO.:  [id number]   MEDICAL RECORD NO.:  [id number]          PATIENT TYPE:  INP   LOCATION:  NA                           [facility]ACILITY:  [facility]   PHYSICIAN:  Ronald A. Gioffre, M.D.DATE OF BIRTH:  Apr 20, 1945   DATE OF ADMISSION:  01/07/2005  DATE OF DISCHARGE:                                HISTORY & PHYSICAL   CHIEF COMPLAINT:  Right knee pain.   HISTORY OF PRESENT ILLNESS:  Patient is a 65 year old gentleman who has had  long term problems with his right knee.  He had a knee arthroscopy probably  15-20 years ago with significant improvement but over the last year, he has  noted increased discomfort, generally about the joint, more so on the medial  aspect with popping, clicking, walking, and swelling.  X-rays reveal bone-on-  bone medial compartment of his right knee with large osteophytes throughout.   ALLERGIES:  No known drug allergies.   CURRENT MEDICATIONS:  1.  Tricor 145 mg p.o. daily.  2.  Lipitor 40 mg p.o. daily.  3.  Altace 5 mg p.o. daily.  4.  Toprol XL 50 mg p.o. daily.   PAST MEDICAL HISTORY:  1.  Includes significant coronary artery disease with CABG of three vessels      in 2003.  2.  Hyperlipidemia.  3.  Hypertension.  4.  Hemorrhoids.   PAST SURGICAL HISTORY:  1.  Appendectomy in 1953.  2.  Left total knee arthroplasty in [place] in 2000.  3.  Open heart surgery with CABG, three vessels, in 2003.  4.  Patient denies any complications of the above-mentioned surgical      procedure.   SOCIAL HISTORY:  Patient is a 65 year old white male slightly heavy-set.  Smokes about one pack of cigarettes a day.  Denies any alcohol use.  Is  married with two children.  He lives in a one story house.  He is currently  employed and working as a [employer] architect.   FAMILY HISTORY:  Mother is deceased at the age of 33 from diabetic  complications.  Father is deceased from complications of multiple myeloma.   REVIEW OF  SYSTEMS:  Positive for pneumonia in 1968 with occasional cough.  No recurrent pneumonia-type symptoms.  Occasional shortness of breath  related with activities related to his cardiac disease, obesity, and poor  conditioning.  Related issues with his hemorrhoids.  He has had a wrist  fracture in 1961 on the right.  Otherwise, review of systems categories are  negative and noncontributory.   PHYSICAL EXAMINATION:  VITAL SIGNS:  Height is 5 foot 8.  Weight is 269  pounds.  Blood pressure 138/78.  Patient is afebrile.  Heart rate is 80 and  regular.  Respirations 12.  GENERAL:  This is a fairly healthy-appearing, well-developed obese white  male who ambulates with a left-sided limp.  Easily gets off and on the exam  table.  Appears to be in no extreme distress.  HEENT:  Head is normocephalic.  Pupils are equal, round and reactive.  Extraocular movements are intact.  External ears are  without deformity.  His  gross hearing is intact.  Oral buccal mucosa is pink and moist.  NECK:  Supple.  No palpable lymphadenopathy.  Thyroid region was nontender.  He had good range of motion of his cervical spine without any difficulty or  tenderness.  LUNGS:  Lung sounds are clear and equal bilaterally.  No wheezes, rales, or  rhonchi.  He had a well-healed midline anterior chest incision.  HEART:  Regular rate and rhythm.  Occasional skipped beat.  No murmurs, rubs  or gallops.  ABDOMEN:  Round, obese.  Soft and nontender.  Bowel sounds normoactive.  CVA  region was nontender.  EXTREMITIES:  Upper extremities were symmetric in size and shape.  He had  good range of motion of the shoulders, elbows, wrist.  Motor strength is  5/5.  Lower extremities:  Right and left hip had full extension.  Flexion up  to 120 degrees.  There was 120 degrees internal and external rotation  without any difficulty.  Left knee had a well-healed midline surgical  incision.  He had full flexion and extension back to 90 degrees.   He had  some slight valgus-varus laxity with typical artificial knee clunking.  Right knee was round and boggy-appearing.  He was tender along the medial  joint line.  He had full flexion and extension back to 95 degrees.  He did  have some valgus-varus laxity.  Calves were slightly sore throughout.  The  patient indicates this is typical due to his lower extremity edema because  he had significant pitting edema from mid tibias all the way down into the  ankles.  He had dorsiflexion and plantar flexion at the ankles.  Peripheral  vascular and carotid pulses were 2+.  No bruits.  Radial pulses were 2+.  Unable to palpate dorsalis pedis or posterior tibial pulses due to 2+  pitting edema from the mid tibia on down into the ankles.  The skin was warm  and blanched briskly.  NEURO:  Patient was conscious, alert, and appropriate.  Easy conversation  with the examiner.  Cranial nerves II-XII are grossly intact.  He had no  gross neurologic defects noted.  BREASTS/RECTAL/GU:  Deferred at this time.   IMPRESSION:  1.  End-stage osteoarthritis, right knee, with bone-on-bone medial      compartment.  2.  History of coronary artery bypass graft with three vessel bypass.  3.  History of hypertension.  4.  History of obesity.  5.  History of hemorrhoids.   PLAN:  Patient will undergo routine labs and tests prior to having a right  total knee arthroplasty by Dr. Darrelyn Hillock at [facility] on October  18th.  NEUROLOGIC:  SKIN:      Jamelle Rushing, P.A.    ______________________________  Georges Lynch Darrelyn Hillock, M.D.   RWK/MEDQ  D:  12/30/2004  T:  12/30/2004  Job:  [id number]

## 2010-08-08 NOTE — Discharge Summary (Signed)
[place]  Patient:    Tony King, Tony King Visit Number: [id number] MRN: [id number]          Service Type: MED Location: 2000 2001 01 Attending Physician:  Tressie Stalker Dictated by:   Areta Haber, Rochel Brome. Admit Date:  03/31/2001 Discharge Date: 04/06/2001   CC:         Marilu Favre H. Cornelius Moras, M.D.  Luis Abed, M.D. [facility]  Elease Hashimoto A. Benedetto Goad, M.D. [facility]   Discharge Summary  HISTORY OF PRESENT ILLNESS:  This is a 65 year old male with a known history of coronary artery disease who has previously undergone percutaneous intervention of the right left anterior descending and right coronary artery. He presented with several episodes of throat discomfort since mid December. These occur with exertion such as walking and yard work and had associated shortness of breath.  Those episodes lasted approximately two to three minutes.  There was no nausea, vomiting, presyncope, or diaphoresis associated with them.  He saw Dr. Orson Gear  in the office and further assessment was made with stress imaging study performed on 03/14/01.  The adenosine Cardiolite showed mild left ventricular systolic dysfunction with an ejection fraction of 42%.  There was ST depression in the high lateral leads.  Imaging study showed a small inferior scar.  He presented this hospitalization for scheduled cardiac catheterization.  REVIEW OF SYSTEMS: Otherwise noncontributory.  PAST MEDICAL HISTORY:  Notable for hypertension, hyperlipidemia, coronary artery disease, status post percutaneous transluminal coronary angioplasty of the left anterior descending in May 1994 as well as percutaneous transluminal coronary angioplasty of the right coronary artery on 08/05/94.  The patient also has arthritis and is status post left knee replacement surgery.  ALLERGIES:  None.  MEDICATIONS ON ADMISSION: 1. Aspirin 325 mg q.d. 2. Toprol XL 50 mg q.d. 3. Norvasc 10 mg q.d. 4. Lipitor  10 mg q.d. 5. Multivitamin one q.d. 6. Advil p.r.n. 7. Nitroglycerin p.r.n.  SOCIAL HISTORY, FAMILY HISTORY, PHYSICAL EXAMINATION:  Please see history and physical done at the time of admission.  HOSPITAL COURSE:  The patient was admitted and taken to the cardiac catheterization laboratory where he was found to have 90% distal left main coronary artery stenosis, left ventricular function was mildly reduced with ejection fraction of 53%.  Cardiac surgical consultation was requested with Dr. Tressie Stalker who evaluated the patient and studies and agreed with recommendations to proceed with the coronary surgical revascularization.  On 04/01/01 the patient was taken to the cardiac operating suite where he underwent the following procedure:  Coronary artery bypass grafting times four.  The grafts included left internal mammary artery to the left anterior descending, saphenous vein graft to the posterior descending, sequential saphenous vein graft to the obtuse marginal 1 and obtuse marginal 2.  The patient tolerated the procedure well and was taken to the surgical intensive care unit in stable condition.  The patient required no intraoperative blood products and came off cardiopulmonary bypass in normal sinus rhythm without inotropic agents.  POSTOPERATIVE HOSPITAL COURSE:  Patient is doing quite well.  He has maintained stable hemodynamics.  He was extubated from the ventilator in a routine manner.  Oxygen has been weaned and he has maintained good saturations on room air.  He has had no significant cardiac dysrhythmias, maintaining normal sinus rhythm.  He is tolerating cardiac rehabilitation phase 1 modalities.  Incisions are healing well without signs of infection.  He is overall felt to be quite stable for tentative discharge in the  morning of 04/06/01 pending morning rounds re-evaluation.  MEDICATIONS ON DISCHARGE: 1. Aspirin 325 mg q.d. 2. Toprol XL 50 mg q.d. 3. Lasix 40 mg q.d.  times 10 days. 4. Potassium chloride 20 mEq q.d. times 10 days. 5. Lipitor 10 mg q.d. 6. Ultram 50 mg one to two q.6h p.r.n.  DISCHARGE INSTRUCTIONS: Patient received written instructions regarding medications, activity, diet, wound care and follow up.  FOLLOW UP:  Follow up will include Dr. Myrtis Ser in two weeks, Dr. Cornelius Moras in three weeks.  FINAL DIAGNOSES: 1. Coronary artery disease. 2. Previous percutaneous transluminal coronary angioplasty of the left    anterior descending 1994. 3. Previous percutaneous transluminal coronary angioplasty of the right    coronary artery in 1996. 4. Arthritis. 5. History of total knee replacement left. 6. Hypertension. 7. Hyperlipidemia. 8. History of previous appendectomy.  LABORATORY DATA:  Most recent laboratory studies revealed hemoglobin and hematocrit 10 and 30 on 04/03/01.  ELECTROLYTES:  Electrolytes on the same date, 04/03/01, show sodium 134, potassium 4.5, chloride 101, carbon dioxide 27, glucose 111, BUN 23, creatinine 0.9.  CONDITION ON DISCHARGE:  Stable and improving. Dictated by:   Areta Haber, Rochel Brome. Attending Physician:  Tressie Stalker DD:  04/05/01 TD:  04/06/01 Job: [id number]

## 2010-08-08 NOTE — Op Note (Signed)
[place]. [facility]  Patient:    Tony King, Tony King Visit Number: [id number] MRN: [id number]          Service Type: MED Location: [id number] Attending Physician:  Tressie Stalker Dictated by:   Salvatore Decent. Cornelius Moras, M.D. Proc. Date: 04/01/01 Admit Date:  03/31/2001   CC:         Luis Abed, M.D. [facility]  Veneda Melter, M.D.  Gilford Rile Benedetto Goad, M.D. [facility]  CVTS Office   Operative Report  PREOPERATIVE DIAGNOSIS:  Left main disease with class 3 new onset angina.  POSTOPERATIVE DIAGNOSIS: Left main disease with class 3 new onset angina.  PROCEDURE:  Median sternotomy for coronary artery bypass grafting x 4 (left internal mammary artery to distal left anterior descending coronary artery, saphenous vein graft to first circumflex marginal branch, and sequential saphenous vein graft to second circumflex marginal branch, saphenous vein graft to distal right coronary artery).  SURGEON:  Salvatore Decent. Cornelius Moras, M.D.  ASSISTANT:  Lissa Merlin, P.A.  ANESTHESIA:  General.  BRIEF CLINICAL NOTE:  The patient is a 65 year old obese, white male with a history of coronary artery disease, hypertension, hyperlipidemia, and tobacco abuse.  He is followed by Dr. Gilford Rile. Shevlin and Dr. Luis Abed, and referred by Dr. Veneda Melter for management of coronary artery disease.  He presented with new onset symptoms of angina and a positive stress Cardiolite examination performed in December 2002.  Cardiac catheterization performed by Dr. Chales Abrahams demonstrated 90% distal stenosis of the left main coronary artery. Left ventricular function is preserved and there remains some other disease involving the remainder of the epicardial coronary arteries.  A full consultation note has been dictated previously.  The patient and his family have been counselled at length regarding the indications, risks, and potential benefits of surgery and they desired to proceed as  described.  OPERATIVE NOTE IN DETAIL:  The patient was brought to the operating room on the above mentioned date and placed in a supine position.  Central invasive hemodynamic monitoring was established by Dr. Burna Forts, including placement of a Swan-Ganz catheter through the right internal jugular approach. A radial arterial line was placed.  The patient was placed in the supine position on the operating table.  Intravenous antibiotics were administered. Following induction with general endotracheal anesthesia, a Foley catheter was placed.  The patients chest, abdomen, and both lower extremities were prepared and draped in a sterile manner.  A median sternotomy incision was performed and the left internal mammary artery was dissected from the chest wall and prepared for bypass grafting. The left internal mammary artery was notably good quality conduit. Simultaneously, saphenous vein was obtained from the patients right lower leg through a longitudinal incision.  The saphenous vein was felt to be good quality conduit.  The patient was heparinized systemically.  The pericardium was opened.  The ascending aorta was normal in appearance. The ascending aorta and the right atrium were cannulated for cardiopulmonary bypass.  Adequate heparinization was verified.  Cardiopulmonary bypass was begun and the surface of the heart was inspected. Distal sites were selected for coronary bypass grafting.  A cardioplegia catheter was placed in the ascending aorta and a temperature probe was placed in the left ventricular septum.  The patient was cooled to 32 degrees systemic temperature.  The aortic cross-clamp was applied and cardioplegia was delivered in antegrade fashion through the aortic root.  Iced saline slush was applied for topical hypothermia.  The initial cardioplegic  arrest and myocardial cooling were felt to be excellent.  Repeat doses of cardioplegia were  administered intermittently throughout the cross-clamp portion of the operation both through the aortic root and down subsequently placed vein grafts to maintain septal temperature below 15 degrees Centigrade.  The following distal coronary anastomoses were performed:  #1 - The distal right coronary artery was grafted with a saphenous vein graft in an end-to-side fashion.  This coronary measured 1.8 mm in diameter and was of fair to good quality.  #2 - The first circumflex marginal branch was grafted with a saphenous vein graft in a side-to-side fashion.  This coronary measured 1.5 mm in diameter and was of good quality.  #3 - The second circumflex marginal branch was grafted using a sequential saphenous vein graft off of the vein placed at the first circumflex marginal branch.  This coronary measured 1.5 mm in diameter and was of good quality.  #4 - The distal left anterior descending coronary artery was grafted with the left internal mammary artery in an end-to-side fashion.  This coronary measured 1.8 mm in diameter and was of good quality.  Both proximal saphenous vein anastomoses were performed directly to the ascending aorta prior to removal of the aortic cross-clamp.  The septal temperature was noted to rise rapidly and dramatically upon reperfusion of the left internal mammary artery.  The aortic cross-clamp was removed after a total cross-clamp time of 61 minutes.  The patient was defibrillated and normal sinus rhythm resumed spontaneously. All proximal and distal anastomoses were inspected for hemostasis and appropriate graft orientation.  Epicardial pacing wires were affixed to the right ventricular outflow tract and to the right atrial appendage.   The patient was rewarmed to greater than 37 degrees Centigrade temperature.  The  patient was weaned from cardiopulmonary bypass without difficulty.  The patients rhythm at separation from bypass was normal sinus rhythm.   No inotropic support was required.  Total cardiopulmonary bypass time for the operation was 77 minutes.  The venous and arterial cannulae were removed uneventfully.  Protamine was administered to reverse the anticoagulation.  The mediastinum and the left chest were irrigated with saline solution containing vancomycin.  Meticulous surgical hemostasis was ascertained.  The mediastinum and the left chest were drained with three chest tubes placed through separate stab incisions inferiorly.  The median sternotomy was closed in routine fashion.  The right lower extremity incision was closed in multiple layers in routine fashion.  All skin incisions were closed with subcuticular skin closures.  The patient tolerated the procedure well and was transported to the surgical intensive care unit in stable condition.  There were no intraoperative complications.  All sponge, instrument and needle counts were verified correct at completion of the operation.  No blood products were administered. Dictated by:   Salvatore Decent Cornelius Moras, M.D. Attending Physician:  Tressie Stalker DD:  04/01/01 TD:  04/02/01 Job: [id number]

## 2014-03-06 ENCOUNTER — Other Ambulatory Visit: Payer: Self-pay | Admitting: Specialist

## 2014-03-06 DIAGNOSIS — M1612 Unilateral primary osteoarthritis, left hip: Secondary | ICD-10-CM

## 2014-03-29 ENCOUNTER — Ambulatory Visit
Admission: RE | Admit: 2014-03-29 | Discharge: 2014-03-29 | Disposition: A | Discharge status: Home / Self Care | Payer: PPO | Source: Ambulatory Visit | Attending: Specialist | Admitting: Specialist

## 2014-03-29 DIAGNOSIS — M1612 Unilateral primary osteoarthritis, left hip: Secondary | ICD-10-CM

## 2014-03-29 MED ORDER — METHYLPREDNISOLONE ACETATE 40 MG/ML INJ SUSP (RADIOLOG
120.0000 mg | Freq: Once | INTRAMUSCULAR | Status: AC
  Administered 2014-03-29: 120 mg via INTRA_ARTICULAR

## 2014-03-29 MED ORDER — IOHEXOL 180 MG/ML  SOLN
1.0000 mL | Freq: Once | INTRAMUSCULAR | Status: AC | PRN
  Administered 2014-03-29: 1 mL via INTRA_ARTICULAR

## 2014-06-29 ENCOUNTER — Encounter: Payer: Self-pay | Admitting: Gastroenterology

## 2014-09-27 DIAGNOSIS — E785 Hyperlipidemia, unspecified: Secondary | ICD-10-CM | POA: Insufficient documentation

## 2014-09-27 DIAGNOSIS — I2510 Atherosclerotic heart disease of native coronary artery without angina pectoris: Secondary | ICD-10-CM | POA: Insufficient documentation

## 2014-09-27 DIAGNOSIS — E1151 Type 2 diabetes mellitus with diabetic peripheral angiopathy without gangrene: Secondary | ICD-10-CM | POA: Insufficient documentation

## 2014-09-27 DIAGNOSIS — I10 Essential (primary) hypertension: Secondary | ICD-10-CM | POA: Insufficient documentation

## 2014-10-04 ENCOUNTER — Encounter: Payer: Self-pay | Admitting: Gastroenterology

## 2014-10-16 ENCOUNTER — Encounter (HOSPITAL_COMMUNITY): Payer: Self-pay

## 2014-10-16 ENCOUNTER — Encounter (HOSPITAL_COMMUNITY)
Admission: RE | Admit: 2014-10-16 | Discharge: 2014-10-16 | Disposition: A | Discharge status: Home / Self Care | Payer: PPO | Source: Ambulatory Visit | Attending: Orthopedic Surgery | Admitting: Orthopedic Surgery

## 2014-10-16 DIAGNOSIS — M1612 Unilateral primary osteoarthritis, left hip: Secondary | ICD-10-CM | POA: Diagnosis not present

## 2014-10-16 DIAGNOSIS — Z01812 Encounter for preprocedural laboratory examination: Secondary | ICD-10-CM | POA: Insufficient documentation

## 2014-10-16 HISTORY — DX: Atherosclerotic heart disease of native coronary artery without angina pectoris: I25.10

## 2014-10-16 HISTORY — DX: Pure hypercholesterolemia, unspecified: E78.00

## 2014-10-16 HISTORY — DX: Unspecified osteoarthritis, unspecified site: M19.90

## 2014-10-16 HISTORY — DX: Angina pectoris, unspecified: I20.9

## 2014-10-16 HISTORY — DX: Gastro-esophageal reflux disease without esophagitis: K21.9

## 2014-10-16 HISTORY — DX: Headache, unspecified: R51.9

## 2014-10-16 HISTORY — DX: Type 2 diabetes mellitus without complications: E11.9

## 2014-10-16 HISTORY — DX: Adverse effect of unspecified anesthetic, initial encounter: T41.45XA

## 2014-10-16 HISTORY — DX: Pneumonia, unspecified organism: J18.9

## 2014-10-16 HISTORY — DX: Personal history of other diseases of the respiratory system: Z87.09

## 2014-10-16 HISTORY — DX: Other complications of anesthesia, initial encounter: T88.59XA

## 2014-10-16 HISTORY — DX: Headache: R51

## 2014-10-16 HISTORY — DX: Essential (primary) hypertension: I10

## 2014-10-16 LAB — SURGICAL PCR SCREEN
MRSA, PCR: NEGATIVE
Staphylococcus aureus: NEGATIVE

## 2014-10-16 LAB — BASIC METABOLIC PANEL
Anion gap: 9 (ref 5–15)
BUN: 23 mg/dL — ABNORMAL HIGH (ref 6–20)
CO2: 29 mmol/L (ref 22–32)
Calcium: 10 mg/dL (ref 8.9–10.3)
Chloride: 103 mmol/L (ref 101–111)
Creatinine, Ser: 1.16 mg/dL (ref 0.61–1.24)
GFR calc Af Amer: >60 mL/min (ref >60)
Glucose, Bld: 106 mg/dL — ABNORMAL HIGH (ref 65–99)
Potassium: 4.9 mmol/L (ref 3.5–5.1)
Sodium: 141 mmol/L (ref 135–145)

## 2014-10-16 LAB — URINALYSIS, ROUTINE W REFLEX MICROSCOPIC
BILIRUBIN URINE: NEGATIVE
GLUCOSE, UA: NEGATIVE mg/dL
Hgb urine dipstick: NEGATIVE
Ketones, ur: NEGATIVE mg/dL
LEUKOCYTES UA: NEGATIVE
Nitrite: NEGATIVE
Protein, ur: NEGATIVE mg/dL
SPECIFIC GRAVITY, URINE: 1.012 (ref 1.005–1.030)
Urobilinogen, UA: 0.2 mg/dL (ref 0.0–1.0)
pH: 5.5 (ref 5.0–8.0)

## 2014-10-16 LAB — CBC
HCT: 45.3 % (ref 39.0–52.0)
Hemoglobin: 14.9 g/dL (ref 13.0–17.0)
MCH: 30.7 pg (ref 26.0–34.0)
MCHC: 32.9 g/dL (ref 30.0–36.0)
MCV: 93.2 fL (ref 78.0–100.0)
Platelets: 243 10*3/uL (ref 150–400)
RBC: 4.86 MIL/uL (ref 4.22–5.81)
RDW: 14.2 % (ref 11.5–15.5)
WBC: 8.8 10*3/uL (ref 4.0–10.5)

## 2014-10-16 LAB — APTT: aPTT: 27 seconds (ref 24–37)

## 2014-10-16 LAB — PROTIME-INR
INR: 1.1 (ref 0.00–1.49)
PROTHROMBIN TIME: 14.4 s (ref 11.6–15.2)

## 2014-10-16 LAB — ABO/RH: ABO/RH(D): A POS

## 2014-10-16 NOTE — Patient Instructions (Signed)
Tony King [facility]  10/16/2014   Your procedure is scheduled on: Tuesday 10/23/14  Report to [facility] Main  Entrance take [facility]  elevators to 3rd floor to  [place] at 05:40 AM.  Call this number if you have problems the morning of surgery [phone]   Remember: ONLY 1 PERSON MAY GO WITH YOU TO SHORT STAY TO GET  READY MORNING OF [place].  Do not eat food or drink liquids :After Midnight.     Take these medicines the morning of surgery with A SIP OF WATER: metoprolol, crestor                               You may not have any metal on your body including hair pins and              piercings  Do not wear jewelry, make-up, lotions, powders or perfumes, deodorant             Do not wear nail polish.  Do not shave  48 hours prior to surgery.              Men may shave face and neck.  Do not bring valuables to the hospital. [place].  Contacts, dentures or bridgework may not be worn into surgery.  Leave suitcase in the car. After surgery it may be brought to your room.              Please read over the following fact sheets you were given: MRSA information  _____________________________________________________________________           [facility] - Preparing for Surgery Before surgery, you can play an important role.  Because skin is not sterile, your skin needs to be as free of germs as possible.  You can reduce the number of germs on your skin by washing with CHG (chlorahexidine gluconate) soap before surgery.  CHG is an antiseptic cleaner which kills germs and bonds with the skin to continue killing germs even after washing. Please DO NOT use if you have an allergy to CHG or antibacterial soaps.  If your skin becomes reddened/irritated stop using the CHG and inform your nurse when you arrive at Short Stay. Do not shave (including legs and underarms) for at least 48 hours prior to the first CHG shower.   You may shave your face/neck. Please follow these instructions carefully:  1.  Shower with CHG Soap the night before surgery and the  morning of Surgery.  2.  If you choose to wash your hair, wash your hair first as usual with your  normal  shampoo.  3.  After you shampoo, rinse your hair and body thoroughly to remove the  shampoo.                            4.  Use CHG as you would any other liquid soap.  You can apply chg directly  to the skin and wash                       Gently with a scrungie or clean washcloth.  5.  Apply the CHG Soap to your body ONLY  FROM THE NECK DOWN.   Do not use on face/ open                           Wound or open sores. Avoid contact with eyes, ears mouth and genitals (private parts).                       Wash face,  Genitals (private parts) with your normal soap.             6.  Wash thoroughly, paying special attention to the area where your surgery  will be performed.  7.  Thoroughly rinse your body with warm water from the neck down.  8.  DO NOT shower/wash with your normal soap after using and rinsing off  the CHG Soap.                9.  Pat yourself dry with a clean towel.            10.  Wear clean pajamas.            11.  Place clean sheets on your bed the night of your first shower and do not  sleep with pets. Day of Surgery : Do not apply any lotions/deodorants the morning of surgery.  Please wear clean clothes to the hospital/surgery center.  FAILURE TO FOLLOW THESE INSTRUCTIONS MAY RESULT IN THE CANCELLATION OF YOUR SURGERY PATIENT SIGNATURE_________________________________  NURSE SIGNATURE__________________________________  ________________________________________________________________________  WHAT IS A BLOOD TRANSFUSION? Blood Transfusion Information  A transfusion is the replacement of blood or some of its parts. Blood is made up of multiple cells which provide different functions.  Red blood cells carry oxygen and are used for blood  loss replacement.  White blood cells fight against infection.  Platelets control bleeding.  Plasma helps clot blood.  Other blood products are available for specialized needs, such as hemophilia or other clotting disorders. BEFORE THE TRANSFUSION  Who gives blood for transfusions?   Healthy volunteers who are fully evaluated to make sure their blood is safe. This is blood bank blood. Transfusion therapy is the safest it has ever been in the practice of medicine. Before blood is taken from a donor, a complete history is taken to make sure that person has no history of diseases nor engages in risky social behavior (examples are intravenous drug use or sexual activity with multiple partners). The donor's travel history is screened to minimize risk of transmitting infections, such as malaria. The donated blood is tested for signs of infectious diseases, such as HIV and hepatitis. The blood is then tested to be sure it is compatible with you in order to minimize the chance of a transfusion reaction. If you or a relative donates blood, this is often done in anticipation of surgery and is not appropriate for emergency situations. It takes many days to process the donated blood. RISKS AND COMPLICATIONS Although transfusion therapy is very safe and saves many lives, the main dangers of transfusion include:  1. Getting an infectious disease. 2. Developing a transfusion reaction. This is an allergic reaction to something in the blood you were given. Every precaution is taken to prevent this. The decision to have a blood transfusion has been considered carefully by your caregiver before blood is given. Blood is not given unless the benefits outweigh the risks. AFTER THE TRANSFUSION  Right after receiving a blood transfusion, you will usually feel much better  and more energetic. This is especially true if your red blood cells have gotten low (anemic). The transfusion raises the level of the red blood cells  which carry oxygen, and this usually causes an energy increase.  The nurse administering the transfusion will monitor you carefully for complications. HOME CARE INSTRUCTIONS  No special instructions are needed after a transfusion. You may find your energy is better. Speak with your caregiver about any limitations on activity for underlying diseases you may have. SEEK MEDICAL CARE IF:   Your condition is not improving after your transfusion.  You develop redness or irritation at the intravenous (IV) site. SEEK IMMEDIATE MEDICAL CARE IF:  Any of the following symptoms occur over the next 12 hours:  Shaking chills.  You have a temperature by mouth above 102 F (38.9 C), not controlled by medicine.  Chest, back, or muscle pain.  People around you feel you are not acting correctly or are confused.  Shortness of breath or difficulty breathing.  Dizziness and fainting.  You get a rash or develop hives.  You have a decrease in urine output.  Your urine turns a dark color or changes to pink, red, or brown. Any of the following symptoms occur over the next 10 days:  You have a temperature by mouth above 102 F (38.9 C), not controlled by medicine.  Shortness of breath.  Weakness after normal activity.  The white part of the eye turns yellow (jaundice).  You have a decrease in the amount of urine or are urinating less often.  Your urine turns a dark color or changes to pink, red, or brown. Document Released: 03/06/2000 Document Revised: 06/01/2011 Document Reviewed: 10/24/2007 ExitCare Patient Information 2014 [place].  _______________________________________________________________________  Incentive Spirometer  An incentive spirometer is a tool that can help keep your lungs clear and active. This tool measures how well you are filling your lungs with each breath. Taking long deep breaths may help reverse or decrease the chance of developing breathing (pulmonary)  problems (especially infection) following:  A long period of time when you are unable to move or be active. BEFORE THE PROCEDURE   If the spirometer includes an indicator to show your best effort, your nurse or respiratory therapist will set it to a desired goal.  If possible, sit up straight or lean slightly forward. Try not to slouch.  Hold the incentive spirometer in an upright position. INSTRUCTIONS FOR USE  3. Sit on the edge of your bed if possible, or sit up as far as you can in bed or on a chair. 4. Hold the incentive spirometer in an upright position. 5. Breathe out normally. 6. Place the mouthpiece in your mouth and seal your lips tightly around it. 7. Breathe in slowly and as deeply as possible, raising the piston or the ball toward the top of the column. 8. Hold your breath for 3-5 seconds or for as long as possible. Allow the piston or ball to fall to the bottom of the column. 9. Remove the mouthpiece from your mouth and breathe out normally. 10. Rest for a few seconds and repeat Steps 1 through 7 at least 10 times every 1-2 hours when you are awake. Take your time and take a few normal breaths between deep breaths. 11. The spirometer may include an indicator to show your best effort. Use the indicator as a goal to work toward during each repetition. 12. After each set of 10 deep breaths, practice coughing to be sure your  lungs are clear. If you have an incision (the cut made at the time of surgery), support your incision when coughing by placing a pillow or rolled up towels firmly against it. Once you are able to get out of bed, walk around indoors and cough well. You may stop using the incentive spirometer when instructed by your caregiver.  RISKS AND COMPLICATIONS  Take your time so you do not get dizzy or light-headed.  If you are in pain, you may need to take or ask for pain medication before doing incentive spirometry. It is harder to take a deep breath if you are having  pain. AFTER USE  Rest and breathe slowly and easily.  It can be helpful to keep track of a log of your progress. Your caregiver can provide you with a simple table to help with this. If you are using the spirometer at home, follow these instructions: [place] IF:   You are having difficultly using the spirometer.  You have trouble using the spirometer as often as instructed.  Your pain medication is not giving enough relief while using the spirometer.  You develop fever of 100.5 F (38.1 C) or higher. SEEK IMMEDIATE MEDICAL CARE IF:   You cough up bloody sputum that had not been present before.  You develop fever of 102 F (38.9 C) or greater.  You develop worsening pain at or near the incision site. MAKE SURE YOU:   Understand these instructions.  Will watch your condition.  Will get help right away if you are not doing well or get worse. Document Released: 07/20/2006 Document Revised: 06/01/2011 Document Reviewed: 09/20/2006 [facility] Patient Information 2014 [place], [place].   ________________________________________________________________________

## 2014-10-16 NOTE — Progress Notes (Signed)
   10/16/14 1114  OBSTRUCTIVE SLEEP APNEA  Have you ever been diagnosed with sleep apnea through a sleep study? No  Do you snore loudly (loud enough to be heard through closed doors)?  0  Do you often feel tired, fatigued, or sleepy during the daytime? 0  Has anyone observed you stop breathing during your sleep? 0  Do you have, or are you being treated for high blood pressure? 1  BMI more than 35 kg/m2? 1  Age over 70 years old? 1  Neck circumference greater than 40 cm/16 inches? 1  Gender: 1

## 2014-10-17 NOTE — H&P (Signed)
TOTAL HIP ADMISSION H&P  Patient is admitted for left total hip arthroplasty, anterior approach.  Subjective:  Chief Complaint:     Left hip primary OA / pain  HPI: Tony King, 69 y.o. male, has a history of pain and functional disability in the left hip(s) due to arthritis and patient has failed non-surgical conservative treatments for greater than 12 weeks to include NSAID's and/or analgesics, corticosteriod injections, use of assistive devices and activity modification.  Onset of symptoms was gradual starting 1.5+ years ago with gradually worsening course since that time.The patient noted no past surgery on the left hip(s).  Patient currently rates pain in the left hip at 9 out of 10 with activity. Patient has night pain, worsening of pain with activity and weight bearing, trendelenberg gait, pain that interfers with activities of daily living and pain with passive range of motion. Patient has evidence of periarticular osteophytes and joint space narrowing by imaging studies. This condition presents safety issues increasing the risk of falls.  There is no current active infection.  Risks, benefits and expectations were discussed with the patient.  Risks including but not limited to the risk of anesthesia, blood clots, nerve damage, blood vessel damage, failure of the prosthesis, infection and up to and including death.  Patient understand the risks, benefits and expectations and wishes to proceed with surgery.   PCP: Teressa Lower, MD  D/C Plans:      Home with HHPT  Post-op Meds:       No Rx given  Tranexamic Acid:      To be given - topically (CAD, triple bypass)  Decadron:      Is to be given  FYI:     Plavix and ASA  Norco post-op  Already have 3-n-1 and RW    Patient Active Problem List   Diagnosis Date Noted  . CAD 07/03/2009  . ESOPHAGEAL REFLUX 07/03/2009   Past Medical History  Diagnosis Date  . Diabetes mellitus without complication     type 2  . Hypertension    under control  . Hypercholesteremia   . Coronary artery disease   . Anginal pain     hx of before CABG  . Pneumonia     hx of  . H/O bronchitis   . H/O pleurisy 1968  . GERD (gastroesophageal reflux disease)   . Arthritis   . Headache     occasional  . Complication of anesthesia     "insides did not start working after surgery"    Past Surgical History  Procedure Laterality Date  . Cholecystectomy  ~2013  . Coronary artery bypass graft  2003    triple  . Cardiac catheterization      x3  . Joint replacement Bilateral     knees  . Appendectomy  ~age 32  . Exploratory laparotomy  ~age 32    with appendectomy  . Hernia repair      with cholecystectomy    No prescriptions prior to admission   No Known Allergies   History  Substance Use Topics  . Smoking status: Current Every Day Smoker -- 0.50 packs/day for 58 years    Types: Cigarettes  . Smokeless tobacco: Never Used  . Alcohol Use: Yes     Comment: occasional       Review of Systems  Constitutional: Negative.   HENT: Negative.   Eyes: Negative.   Respiratory: Negative.   Cardiovascular: Positive for leg swelling.  Gastrointestinal: Positive for heartburn.  Genitourinary: Negative.   Musculoskeletal: Positive for joint pain.  Skin: Negative.   Neurological: Negative.   Endo/Heme/Allergies: Negative.   Psychiatric/Behavioral: Negative.     Objective:  Physical Exam  Constitutional: He is oriented to person, place, and time. He appears well-developed.  HENT:  Head: Normocephalic.  Eyes: Pupils are equal, round, and reactive to light.  Neck: Neck supple. No JVD present. No tracheal deviation present. No thyromegaly present.  Cardiovascular: Normal rate, regular rhythm, normal heart sounds and intact distal pulses.   Respiratory: Effort normal and breath sounds normal. No stridor. No respiratory distress. He has no wheezes.  GI: Soft. There is no tenderness. There is no guarding.  Musculoskeletal:        Left hip: He exhibits decreased range of motion, decreased strength, tenderness and bony tenderness. He exhibits no swelling, no deformity and no laceration.  Lymphadenopathy:    He has no cervical adenopathy.  Neurological: He is alert and oriented to person, place, and time.  Skin: Skin is warm and dry.  Psychiatric: He has a normal mood and affect.      Labs:  Estimated body mass index is 42.13 kg/(m^2) as calculated from the following:   Height as of 07/03/09: 5\' 8"  (1.727 m).   Weight as of 07/03/09: 125.646 kg (277 lb).   Imaging Review Plain radiographs demonstrate severe degenerative joint disease of the left hip(s). The bone quality appears to be good for age and reported activity level.  Assessment/Plan:  End stage arthritis, left hip(s)  The patient history, physical examination, clinical judgement of the provider and imaging studies are consistent with end stage degenerative joint disease of the left hip(s) and total hip arthroplasty is deemed medically necessary. The treatment options including medical management, injection therapy, arthroscopy and arthroplasty were discussed at length. The risks and benefits of total hip arthroplasty were presented and reviewed. The risks due to aseptic loosening, infection, stiffness, dislocation/subluxation,  thromboembolic complications and other imponderables were discussed.  The patient acknowledged the explanation, agreed to proceed with the plan and consent was signed. Patient is being admitted for inpatient treatment for surgery, pain control, PT, OT, prophylactic antibiotics, VTE prophylaxis, progressive ambulation and ADL's and discharge planning.The patient is planning to be discharged home with home health services.      Tony Pugh Nymir Ringler   PA-C  10/17/2014, 2:32 PM

## 2014-10-18 NOTE — Progress Notes (Signed)
VM message left with Judeen Hammans / scheduler with Dr Alvan Dame in regards to no cardiac clearance per Dr Agustin Cree. This nurse has attempted to contact Dr Flo Shanks office without success an also a faxed request has been sent without success of obtaining information. Noted in care everywhere per epic pt was seen by Dr Agustin Cree on 09/27/2014.

## 2014-10-23 ENCOUNTER — Inpatient Hospital Stay (HOSPITAL_COMMUNITY): Admission: RE | Admit: 2014-10-23 | Payer: PPO | Source: Ambulatory Visit | Admitting: Orthopedic Surgery

## 2014-10-23 ENCOUNTER — Encounter (HOSPITAL_COMMUNITY): Admission: RE | Payer: Self-pay | Source: Ambulatory Visit

## 2014-10-23 LAB — TYPE AND SCREEN
ABO/RH(D): A POS
Antibody Screen: NEGATIVE

## 2014-10-23 SURGERY — ARTHROPLASTY, HIP, TOTAL, ANTERIOR APPROACH
Anesthesia: Spinal | Laterality: Left

## 2014-10-24 DIAGNOSIS — R9439 Abnormal result of other cardiovascular function study: Secondary | ICD-10-CM | POA: Insufficient documentation

## 2014-12-04 DIAGNOSIS — R600 Localized edema: Secondary | ICD-10-CM | POA: Insufficient documentation

## 2015-01-23 DIAGNOSIS — I70213 Atherosclerosis of native arteries of extremities with intermittent claudication, bilateral legs: Secondary | ICD-10-CM | POA: Insufficient documentation

## 2015-01-23 DIAGNOSIS — Z951 Presence of aortocoronary bypass graft: Secondary | ICD-10-CM | POA: Insufficient documentation

## 2015-01-23 DIAGNOSIS — Z720 Tobacco use: Secondary | ICD-10-CM | POA: Insufficient documentation

## 2015-04-15 DIAGNOSIS — D485 Neoplasm of uncertain behavior of skin: Secondary | ICD-10-CM | POA: Diagnosis not present

## 2015-04-15 DIAGNOSIS — L578 Other skin changes due to chronic exposure to nonionizing radiation: Secondary | ICD-10-CM | POA: Diagnosis not present

## 2015-04-15 DIAGNOSIS — D225 Melanocytic nevi of trunk: Secondary | ICD-10-CM | POA: Diagnosis not present

## 2015-06-17 DIAGNOSIS — I70213 Atherosclerosis of native arteries of extremities with intermittent claudication, bilateral legs: Secondary | ICD-10-CM | POA: Diagnosis not present

## 2015-06-17 DIAGNOSIS — R600 Localized edema: Secondary | ICD-10-CM | POA: Diagnosis not present

## 2015-06-17 DIAGNOSIS — E1151 Type 2 diabetes mellitus with diabetic peripheral angiopathy without gangrene: Secondary | ICD-10-CM | POA: Diagnosis not present

## 2015-06-17 DIAGNOSIS — Z720 Tobacco use: Secondary | ICD-10-CM | POA: Diagnosis not present

## 2015-06-17 DIAGNOSIS — I10 Essential (primary) hypertension: Secondary | ICD-10-CM | POA: Diagnosis not present

## 2015-06-17 DIAGNOSIS — E785 Hyperlipidemia, unspecified: Secondary | ICD-10-CM | POA: Diagnosis not present

## 2015-06-17 DIAGNOSIS — I2510 Atherosclerotic heart disease of native coronary artery without angina pectoris: Secondary | ICD-10-CM | POA: Diagnosis not present

## 2015-06-21 DIAGNOSIS — E782 Mixed hyperlipidemia: Secondary | ICD-10-CM | POA: Insufficient documentation

## 2015-06-24 DIAGNOSIS — R600 Localized edema: Secondary | ICD-10-CM | POA: Diagnosis not present

## 2015-06-24 DIAGNOSIS — I10 Essential (primary) hypertension: Secondary | ICD-10-CM | POA: Diagnosis not present

## 2015-06-24 DIAGNOSIS — E119 Type 2 diabetes mellitus without complications: Secondary | ICD-10-CM | POA: Diagnosis not present

## 2015-06-24 DIAGNOSIS — I2510 Atherosclerotic heart disease of native coronary artery without angina pectoris: Secondary | ICD-10-CM | POA: Diagnosis not present

## 2015-06-24 DIAGNOSIS — E782 Mixed hyperlipidemia: Secondary | ICD-10-CM | POA: Diagnosis not present

## 2015-06-26 DIAGNOSIS — I2510 Atherosclerotic heart disease of native coronary artery without angina pectoris: Secondary | ICD-10-CM | POA: Diagnosis not present

## 2015-07-22 DIAGNOSIS — E1151 Type 2 diabetes mellitus with diabetic peripheral angiopathy without gangrene: Secondary | ICD-10-CM | POA: Diagnosis not present

## 2015-07-22 DIAGNOSIS — I2510 Atherosclerotic heart disease of native coronary artery without angina pectoris: Secondary | ICD-10-CM | POA: Diagnosis not present

## 2015-07-22 DIAGNOSIS — E785 Hyperlipidemia, unspecified: Secondary | ICD-10-CM | POA: Diagnosis not present

## 2015-07-22 DIAGNOSIS — I10 Essential (primary) hypertension: Secondary | ICD-10-CM | POA: Diagnosis not present

## 2015-08-20 DIAGNOSIS — H52223 Regular astigmatism, bilateral: Secondary | ICD-10-CM | POA: Diagnosis not present

## 2015-08-20 DIAGNOSIS — E119 Type 2 diabetes mellitus without complications: Secondary | ICD-10-CM | POA: Diagnosis not present

## 2015-08-20 DIAGNOSIS — H2513 Age-related nuclear cataract, bilateral: Secondary | ICD-10-CM | POA: Diagnosis not present

## 2015-09-06 DIAGNOSIS — I10 Essential (primary) hypertension: Secondary | ICD-10-CM | POA: Diagnosis not present

## 2015-09-06 DIAGNOSIS — E1151 Type 2 diabetes mellitus with diabetic peripheral angiopathy without gangrene: Secondary | ICD-10-CM | POA: Diagnosis not present

## 2015-09-06 DIAGNOSIS — Z720 Tobacco use: Secondary | ICD-10-CM | POA: Diagnosis not present

## 2015-09-06 DIAGNOSIS — Z951 Presence of aortocoronary bypass graft: Secondary | ICD-10-CM | POA: Diagnosis not present

## 2015-09-06 DIAGNOSIS — E785 Hyperlipidemia, unspecified: Secondary | ICD-10-CM | POA: Diagnosis not present

## 2015-09-06 DIAGNOSIS — Z794 Long term (current) use of insulin: Secondary | ICD-10-CM | POA: Diagnosis not present

## 2015-09-06 DIAGNOSIS — I2510 Atherosclerotic heart disease of native coronary artery without angina pectoris: Secondary | ICD-10-CM | POA: Diagnosis not present

## 2015-09-11 DIAGNOSIS — Z951 Presence of aortocoronary bypass graft: Secondary | ICD-10-CM | POA: Diagnosis not present

## 2015-09-11 DIAGNOSIS — I10 Essential (primary) hypertension: Secondary | ICD-10-CM | POA: Diagnosis not present

## 2015-09-11 DIAGNOSIS — E1151 Type 2 diabetes mellitus with diabetic peripheral angiopathy without gangrene: Secondary | ICD-10-CM | POA: Diagnosis not present

## 2015-09-11 DIAGNOSIS — E785 Hyperlipidemia, unspecified: Secondary | ICD-10-CM | POA: Diagnosis not present

## 2015-09-11 DIAGNOSIS — Z720 Tobacco use: Secondary | ICD-10-CM | POA: Diagnosis not present

## 2015-09-11 DIAGNOSIS — I2510 Atherosclerotic heart disease of native coronary artery without angina pectoris: Secondary | ICD-10-CM | POA: Diagnosis not present

## 2015-09-11 DIAGNOSIS — Z794 Long term (current) use of insulin: Secondary | ICD-10-CM | POA: Diagnosis not present

## 2015-09-27 DIAGNOSIS — R9439 Abnormal result of other cardiovascular function study: Secondary | ICD-10-CM | POA: Diagnosis not present

## 2015-09-27 DIAGNOSIS — I10 Essential (primary) hypertension: Secondary | ICD-10-CM | POA: Diagnosis not present

## 2015-09-27 DIAGNOSIS — Z794 Long term (current) use of insulin: Secondary | ICD-10-CM | POA: Diagnosis not present

## 2015-09-27 DIAGNOSIS — E785 Hyperlipidemia, unspecified: Secondary | ICD-10-CM | POA: Diagnosis not present

## 2015-09-27 DIAGNOSIS — I2510 Atherosclerotic heart disease of native coronary artery without angina pectoris: Secondary | ICD-10-CM | POA: Diagnosis not present

## 2015-09-27 DIAGNOSIS — Z951 Presence of aortocoronary bypass graft: Secondary | ICD-10-CM | POA: Diagnosis not present

## 2015-09-27 DIAGNOSIS — E1151 Type 2 diabetes mellitus with diabetic peripheral angiopathy without gangrene: Secondary | ICD-10-CM | POA: Diagnosis not present

## 2015-10-31 DIAGNOSIS — I10 Essential (primary) hypertension: Secondary | ICD-10-CM | POA: Diagnosis not present

## 2015-10-31 DIAGNOSIS — E1151 Type 2 diabetes mellitus with diabetic peripheral angiopathy without gangrene: Secondary | ICD-10-CM | POA: Diagnosis not present

## 2015-10-31 DIAGNOSIS — Z794 Long term (current) use of insulin: Secondary | ICD-10-CM | POA: Diagnosis not present

## 2015-10-31 DIAGNOSIS — E785 Hyperlipidemia, unspecified: Secondary | ICD-10-CM | POA: Diagnosis not present

## 2015-10-31 DIAGNOSIS — Z951 Presence of aortocoronary bypass graft: Secondary | ICD-10-CM | POA: Diagnosis not present

## 2015-10-31 DIAGNOSIS — Z720 Tobacco use: Secondary | ICD-10-CM | POA: Diagnosis not present

## 2015-10-31 DIAGNOSIS — R9439 Abnormal result of other cardiovascular function study: Secondary | ICD-10-CM | POA: Diagnosis not present

## 2015-10-31 DIAGNOSIS — I2510 Atherosclerotic heart disease of native coronary artery without angina pectoris: Secondary | ICD-10-CM | POA: Diagnosis not present

## 2015-12-02 DIAGNOSIS — K601 Chronic anal fissure: Secondary | ICD-10-CM | POA: Diagnosis not present

## 2015-12-02 DIAGNOSIS — E119 Type 2 diabetes mellitus without complications: Secondary | ICD-10-CM | POA: Diagnosis not present

## 2015-12-03 DIAGNOSIS — Z6838 Body mass index (BMI) 38.0-38.9, adult: Secondary | ICD-10-CM | POA: Diagnosis not present

## 2015-12-03 DIAGNOSIS — K601 Chronic anal fissure: Secondary | ICD-10-CM | POA: Diagnosis not present

## 2015-12-03 DIAGNOSIS — E669 Obesity, unspecified: Secondary | ICD-10-CM | POA: Diagnosis not present

## 2015-12-03 DIAGNOSIS — K6289 Other specified diseases of anus and rectum: Secondary | ICD-10-CM | POA: Diagnosis not present

## 2016-01-21 DIAGNOSIS — K6289 Other specified diseases of anus and rectum: Secondary | ICD-10-CM | POA: Diagnosis not present

## 2016-01-21 DIAGNOSIS — K601 Chronic anal fissure: Secondary | ICD-10-CM | POA: Diagnosis not present

## 2016-01-21 DIAGNOSIS — Z6837 Body mass index (BMI) 37.0-37.9, adult: Secondary | ICD-10-CM | POA: Diagnosis not present

## 2016-01-23 DIAGNOSIS — L578 Other skin changes due to chronic exposure to nonionizing radiation: Secondary | ICD-10-CM | POA: Diagnosis not present

## 2016-01-23 DIAGNOSIS — R600 Localized edema: Secondary | ICD-10-CM | POA: Diagnosis not present

## 2016-01-23 DIAGNOSIS — D485 Neoplasm of uncertain behavior of skin: Secondary | ICD-10-CM | POA: Diagnosis not present

## 2016-01-23 DIAGNOSIS — I8310 Varicose veins of unspecified lower extremity with inflammation: Secondary | ICD-10-CM | POA: Diagnosis not present

## 2016-01-24 DIAGNOSIS — Z23 Encounter for immunization: Secondary | ICD-10-CM | POA: Diagnosis not present

## 2016-01-24 DIAGNOSIS — E119 Type 2 diabetes mellitus without complications: Secondary | ICD-10-CM | POA: Diagnosis not present

## 2016-01-24 DIAGNOSIS — R600 Localized edema: Secondary | ICD-10-CM | POA: Diagnosis not present

## 2016-01-24 DIAGNOSIS — E782 Mixed hyperlipidemia: Secondary | ICD-10-CM | POA: Diagnosis not present

## 2016-01-24 DIAGNOSIS — I10 Essential (primary) hypertension: Secondary | ICD-10-CM | POA: Diagnosis not present

## 2016-01-24 DIAGNOSIS — I2510 Atherosclerotic heart disease of native coronary artery without angina pectoris: Secondary | ICD-10-CM | POA: Diagnosis not present

## 2016-02-03 DIAGNOSIS — E785 Hyperlipidemia, unspecified: Secondary | ICD-10-CM | POA: Diagnosis not present

## 2016-02-03 DIAGNOSIS — I2510 Atherosclerotic heart disease of native coronary artery without angina pectoris: Secondary | ICD-10-CM | POA: Diagnosis not present

## 2016-02-03 DIAGNOSIS — E1151 Type 2 diabetes mellitus with diabetic peripheral angiopathy without gangrene: Secondary | ICD-10-CM | POA: Diagnosis not present

## 2016-02-03 DIAGNOSIS — I10 Essential (primary) hypertension: Secondary | ICD-10-CM | POA: Diagnosis not present

## 2016-02-03 DIAGNOSIS — Z720 Tobacco use: Secondary | ICD-10-CM | POA: Diagnosis not present

## 2016-02-03 DIAGNOSIS — Z951 Presence of aortocoronary bypass graft: Secondary | ICD-10-CM | POA: Diagnosis not present

## 2016-02-06 DIAGNOSIS — I2510 Atherosclerotic heart disease of native coronary artery without angina pectoris: Secondary | ICD-10-CM | POA: Diagnosis not present

## 2016-02-18 DIAGNOSIS — K6289 Other specified diseases of anus and rectum: Secondary | ICD-10-CM | POA: Diagnosis not present

## 2016-02-18 DIAGNOSIS — K602 Anal fissure, unspecified: Secondary | ICD-10-CM | POA: Diagnosis not present

## 2016-02-18 DIAGNOSIS — E669 Obesity, unspecified: Secondary | ICD-10-CM | POA: Diagnosis not present

## 2016-02-18 DIAGNOSIS — Z6837 Body mass index (BMI) 37.0-37.9, adult: Secondary | ICD-10-CM | POA: Diagnosis not present

## 2016-03-11 DIAGNOSIS — E669 Obesity, unspecified: Secondary | ICD-10-CM | POA: Diagnosis not present

## 2016-03-11 DIAGNOSIS — E785 Hyperlipidemia, unspecified: Secondary | ICD-10-CM | POA: Diagnosis not present

## 2016-03-11 DIAGNOSIS — Z79899 Other long term (current) drug therapy: Secondary | ICD-10-CM | POA: Diagnosis not present

## 2016-03-11 DIAGNOSIS — Z7984 Long term (current) use of oral hypoglycemic drugs: Secondary | ICD-10-CM | POA: Diagnosis not present

## 2016-03-11 DIAGNOSIS — K601 Chronic anal fissure: Secondary | ICD-10-CM | POA: Diagnosis not present

## 2016-03-11 DIAGNOSIS — E1151 Type 2 diabetes mellitus with diabetic peripheral angiopathy without gangrene: Secondary | ICD-10-CM | POA: Diagnosis not present

## 2016-03-11 DIAGNOSIS — K219 Gastro-esophageal reflux disease without esophagitis: Secondary | ICD-10-CM | POA: Diagnosis not present

## 2016-03-11 DIAGNOSIS — E119 Type 2 diabetes mellitus without complications: Secondary | ICD-10-CM | POA: Diagnosis not present

## 2016-03-11 DIAGNOSIS — K624 Stenosis of anus and rectum: Secondary | ICD-10-CM | POA: Diagnosis not present

## 2016-03-11 DIAGNOSIS — I110 Hypertensive heart disease with heart failure: Secondary | ICD-10-CM | POA: Diagnosis not present

## 2016-03-11 DIAGNOSIS — Z7902 Long term (current) use of antithrombotics/antiplatelets: Secondary | ICD-10-CM | POA: Diagnosis not present

## 2016-03-11 DIAGNOSIS — K602 Anal fissure, unspecified: Secondary | ICD-10-CM | POA: Diagnosis not present

## 2016-03-11 DIAGNOSIS — Z6837 Body mass index (BMI) 37.0-37.9, adult: Secondary | ICD-10-CM | POA: Diagnosis not present

## 2016-03-11 DIAGNOSIS — E782 Mixed hyperlipidemia: Secondary | ICD-10-CM | POA: Diagnosis not present

## 2016-03-11 DIAGNOSIS — I509 Heart failure, unspecified: Secondary | ICD-10-CM | POA: Diagnosis not present

## 2016-03-11 DIAGNOSIS — Z7982 Long term (current) use of aspirin: Secondary | ICD-10-CM | POA: Diagnosis not present

## 2016-03-11 DIAGNOSIS — F17210 Nicotine dependence, cigarettes, uncomplicated: Secondary | ICD-10-CM | POA: Diagnosis not present

## 2016-03-11 DIAGNOSIS — Z01818 Encounter for other preprocedural examination: Secondary | ICD-10-CM | POA: Diagnosis not present

## 2016-03-24 DIAGNOSIS — K602 Anal fissure, unspecified: Secondary | ICD-10-CM | POA: Diagnosis not present

## 2016-03-27 DIAGNOSIS — E782 Mixed hyperlipidemia: Secondary | ICD-10-CM | POA: Diagnosis not present

## 2016-05-11 DIAGNOSIS — E1151 Type 2 diabetes mellitus with diabetic peripheral angiopathy without gangrene: Secondary | ICD-10-CM | POA: Diagnosis not present

## 2016-05-11 DIAGNOSIS — Z951 Presence of aortocoronary bypass graft: Secondary | ICD-10-CM | POA: Diagnosis not present

## 2016-05-11 DIAGNOSIS — I10 Essential (primary) hypertension: Secondary | ICD-10-CM | POA: Diagnosis not present

## 2016-05-11 DIAGNOSIS — E785 Hyperlipidemia, unspecified: Secondary | ICD-10-CM | POA: Diagnosis not present

## 2016-05-11 DIAGNOSIS — I2510 Atherosclerotic heart disease of native coronary artery without angina pectoris: Secondary | ICD-10-CM | POA: Diagnosis not present

## 2016-05-26 DIAGNOSIS — I2510 Atherosclerotic heart disease of native coronary artery without angina pectoris: Secondary | ICD-10-CM | POA: Diagnosis not present

## 2016-06-10 DIAGNOSIS — Z794 Long term (current) use of insulin: Secondary | ICD-10-CM | POA: Diagnosis not present

## 2016-06-10 DIAGNOSIS — I10 Essential (primary) hypertension: Secondary | ICD-10-CM | POA: Diagnosis not present

## 2016-06-10 DIAGNOSIS — I420 Dilated cardiomyopathy: Secondary | ICD-10-CM | POA: Diagnosis not present

## 2016-06-10 DIAGNOSIS — E1151 Type 2 diabetes mellitus with diabetic peripheral angiopathy without gangrene: Secondary | ICD-10-CM | POA: Diagnosis not present

## 2016-06-10 DIAGNOSIS — I2510 Atherosclerotic heart disease of native coronary artery without angina pectoris: Secondary | ICD-10-CM | POA: Diagnosis not present

## 2016-06-10 DIAGNOSIS — Z951 Presence of aortocoronary bypass graft: Secondary | ICD-10-CM | POA: Diagnosis not present

## 2016-06-18 DIAGNOSIS — I420 Dilated cardiomyopathy: Secondary | ICD-10-CM | POA: Diagnosis not present

## 2016-06-18 DIAGNOSIS — I10 Essential (primary) hypertension: Secondary | ICD-10-CM | POA: Diagnosis not present

## 2016-06-18 DIAGNOSIS — E1151 Type 2 diabetes mellitus with diabetic peripheral angiopathy without gangrene: Secondary | ICD-10-CM | POA: Diagnosis not present

## 2016-06-18 DIAGNOSIS — Z951 Presence of aortocoronary bypass graft: Secondary | ICD-10-CM | POA: Diagnosis not present

## 2016-06-18 DIAGNOSIS — Z794 Long term (current) use of insulin: Secondary | ICD-10-CM | POA: Diagnosis not present

## 2016-06-18 DIAGNOSIS — I2510 Atherosclerotic heart disease of native coronary artery without angina pectoris: Secondary | ICD-10-CM | POA: Diagnosis not present

## 2016-07-23 DIAGNOSIS — I10 Essential (primary) hypertension: Secondary | ICD-10-CM | POA: Diagnosis not present

## 2016-07-23 DIAGNOSIS — I2510 Atherosclerotic heart disease of native coronary artery without angina pectoris: Secondary | ICD-10-CM | POA: Diagnosis not present

## 2016-07-23 DIAGNOSIS — E782 Mixed hyperlipidemia: Secondary | ICD-10-CM | POA: Diagnosis not present

## 2016-07-23 DIAGNOSIS — I420 Dilated cardiomyopathy: Secondary | ICD-10-CM | POA: Diagnosis not present

## 2016-07-23 DIAGNOSIS — E119 Type 2 diabetes mellitus without complications: Secondary | ICD-10-CM | POA: Diagnosis not present

## 2016-07-23 DIAGNOSIS — E1151 Type 2 diabetes mellitus with diabetic peripheral angiopathy without gangrene: Secondary | ICD-10-CM | POA: Diagnosis not present

## 2016-07-29 DIAGNOSIS — I420 Dilated cardiomyopathy: Secondary | ICD-10-CM | POA: Diagnosis not present

## 2016-07-29 DIAGNOSIS — I10 Essential (primary) hypertension: Secondary | ICD-10-CM | POA: Diagnosis not present

## 2016-07-29 DIAGNOSIS — I2510 Atherosclerotic heart disease of native coronary artery without angina pectoris: Secondary | ICD-10-CM | POA: Diagnosis not present

## 2016-07-29 DIAGNOSIS — E1151 Type 2 diabetes mellitus with diabetic peripheral angiopathy without gangrene: Secondary | ICD-10-CM | POA: Diagnosis not present

## 2016-07-30 DIAGNOSIS — I70213 Atherosclerosis of native arteries of extremities with intermittent claudication, bilateral legs: Secondary | ICD-10-CM | POA: Diagnosis not present

## 2016-07-30 DIAGNOSIS — I10 Essential (primary) hypertension: Secondary | ICD-10-CM | POA: Diagnosis not present

## 2016-07-30 DIAGNOSIS — I420 Dilated cardiomyopathy: Secondary | ICD-10-CM | POA: Diagnosis not present

## 2016-07-30 DIAGNOSIS — I2510 Atherosclerotic heart disease of native coronary artery without angina pectoris: Secondary | ICD-10-CM | POA: Diagnosis not present

## 2016-07-30 DIAGNOSIS — E119 Type 2 diabetes mellitus without complications: Secondary | ICD-10-CM | POA: Diagnosis not present

## 2016-07-30 DIAGNOSIS — E782 Mixed hyperlipidemia: Secondary | ICD-10-CM | POA: Diagnosis not present

## 2016-08-10 DIAGNOSIS — I2510 Atherosclerotic heart disease of native coronary artery without angina pectoris: Secondary | ICD-10-CM | POA: Diagnosis not present

## 2016-08-10 DIAGNOSIS — I10 Essential (primary) hypertension: Secondary | ICD-10-CM | POA: Diagnosis not present

## 2016-08-10 DIAGNOSIS — I70213 Atherosclerosis of native arteries of extremities with intermittent claudication, bilateral legs: Secondary | ICD-10-CM | POA: Diagnosis not present

## 2016-08-10 DIAGNOSIS — E785 Hyperlipidemia, unspecified: Secondary | ICD-10-CM | POA: Diagnosis not present

## 2016-08-10 DIAGNOSIS — E1151 Type 2 diabetes mellitus with diabetic peripheral angiopathy without gangrene: Secondary | ICD-10-CM | POA: Diagnosis not present

## 2016-08-10 DIAGNOSIS — Z951 Presence of aortocoronary bypass graft: Secondary | ICD-10-CM | POA: Diagnosis not present

## 2016-08-10 DIAGNOSIS — Z720 Tobacco use: Secondary | ICD-10-CM | POA: Diagnosis not present

## 2016-08-14 DIAGNOSIS — I7092 Chronic total occlusion of artery of the extremities: Secondary | ICD-10-CM | POA: Diagnosis not present

## 2016-08-14 DIAGNOSIS — I70213 Atherosclerosis of native arteries of extremities with intermittent claudication, bilateral legs: Secondary | ICD-10-CM | POA: Diagnosis not present

## 2016-08-31 DIAGNOSIS — M25562 Pain in left knee: Secondary | ICD-10-CM | POA: Diagnosis not present

## 2016-08-31 DIAGNOSIS — M1612 Unilateral primary osteoarthritis, left hip: Secondary | ICD-10-CM | POA: Diagnosis not present

## 2016-08-31 DIAGNOSIS — M25552 Pain in left hip: Secondary | ICD-10-CM | POA: Diagnosis not present

## 2016-09-02 DIAGNOSIS — M1612 Unilateral primary osteoarthritis, left hip: Secondary | ICD-10-CM | POA: Diagnosis not present

## 2016-09-02 DIAGNOSIS — M25552 Pain in left hip: Secondary | ICD-10-CM | POA: Diagnosis not present

## 2016-09-03 DIAGNOSIS — Z720 Tobacco use: Secondary | ICD-10-CM | POA: Diagnosis not present

## 2016-09-03 DIAGNOSIS — E785 Hyperlipidemia, unspecified: Secondary | ICD-10-CM | POA: Diagnosis not present

## 2016-09-03 DIAGNOSIS — Z951 Presence of aortocoronary bypass graft: Secondary | ICD-10-CM | POA: Diagnosis not present

## 2016-09-03 DIAGNOSIS — I70213 Atherosclerosis of native arteries of extremities with intermittent claudication, bilateral legs: Secondary | ICD-10-CM | POA: Diagnosis not present

## 2016-09-03 DIAGNOSIS — I2510 Atherosclerotic heart disease of native coronary artery without angina pectoris: Secondary | ICD-10-CM | POA: Diagnosis not present

## 2016-09-03 DIAGNOSIS — I10 Essential (primary) hypertension: Secondary | ICD-10-CM | POA: Diagnosis not present

## 2016-09-03 DIAGNOSIS — E1151 Type 2 diabetes mellitus with diabetic peripheral angiopathy without gangrene: Secondary | ICD-10-CM | POA: Diagnosis not present

## 2016-09-07 DIAGNOSIS — M1612 Unilateral primary osteoarthritis, left hip: Secondary | ICD-10-CM | POA: Diagnosis not present

## 2016-09-07 DIAGNOSIS — M25552 Pain in left hip: Secondary | ICD-10-CM | POA: Diagnosis not present

## 2016-09-14 DIAGNOSIS — M25552 Pain in left hip: Secondary | ICD-10-CM | POA: Diagnosis not present

## 2016-09-14 DIAGNOSIS — M1612 Unilateral primary osteoarthritis, left hip: Secondary | ICD-10-CM | POA: Diagnosis not present

## 2016-09-16 DIAGNOSIS — R739 Hyperglycemia, unspecified: Secondary | ICD-10-CM | POA: Diagnosis not present

## 2016-09-17 DIAGNOSIS — M25552 Pain in left hip: Secondary | ICD-10-CM | POA: Diagnosis not present

## 2016-09-17 DIAGNOSIS — M1612 Unilateral primary osteoarthritis, left hip: Secondary | ICD-10-CM | POA: Diagnosis not present

## 2016-09-21 DIAGNOSIS — M25552 Pain in left hip: Secondary | ICD-10-CM | POA: Diagnosis not present

## 2016-09-21 DIAGNOSIS — M1612 Unilateral primary osteoarthritis, left hip: Secondary | ICD-10-CM | POA: Diagnosis not present

## 2016-09-24 DIAGNOSIS — M25552 Pain in left hip: Secondary | ICD-10-CM | POA: Diagnosis not present

## 2016-09-24 DIAGNOSIS — M1612 Unilateral primary osteoarthritis, left hip: Secondary | ICD-10-CM | POA: Diagnosis not present

## 2016-09-28 DIAGNOSIS — M25552 Pain in left hip: Secondary | ICD-10-CM | POA: Diagnosis not present

## 2016-09-28 DIAGNOSIS — M1612 Unilateral primary osteoarthritis, left hip: Secondary | ICD-10-CM | POA: Diagnosis not present

## 2016-09-30 DIAGNOSIS — M1612 Unilateral primary osteoarthritis, left hip: Secondary | ICD-10-CM | POA: Diagnosis not present

## 2016-09-30 DIAGNOSIS — M25552 Pain in left hip: Secondary | ICD-10-CM | POA: Diagnosis not present

## 2016-10-05 DIAGNOSIS — M1612 Unilateral primary osteoarthritis, left hip: Secondary | ICD-10-CM | POA: Diagnosis not present

## 2016-10-05 DIAGNOSIS — M25552 Pain in left hip: Secondary | ICD-10-CM | POA: Diagnosis not present

## 2016-10-14 DIAGNOSIS — M1612 Unilateral primary osteoarthritis, left hip: Secondary | ICD-10-CM | POA: Diagnosis not present

## 2016-10-14 DIAGNOSIS — M25552 Pain in left hip: Secondary | ICD-10-CM | POA: Diagnosis not present

## 2016-10-26 DIAGNOSIS — M1612 Unilateral primary osteoarthritis, left hip: Secondary | ICD-10-CM | POA: Diagnosis not present

## 2016-10-26 DIAGNOSIS — M25552 Pain in left hip: Secondary | ICD-10-CM | POA: Diagnosis not present

## 2016-10-27 DIAGNOSIS — H524 Presbyopia: Secondary | ICD-10-CM | POA: Diagnosis not present

## 2016-10-27 DIAGNOSIS — H2513 Age-related nuclear cataract, bilateral: Secondary | ICD-10-CM | POA: Diagnosis not present

## 2016-10-27 DIAGNOSIS — E119 Type 2 diabetes mellitus without complications: Secondary | ICD-10-CM | POA: Diagnosis not present

## 2016-10-29 DIAGNOSIS — M1612 Unilateral primary osteoarthritis, left hip: Secondary | ICD-10-CM | POA: Diagnosis not present

## 2016-10-29 DIAGNOSIS — M25552 Pain in left hip: Secondary | ICD-10-CM | POA: Diagnosis not present

## 2016-11-02 DIAGNOSIS — M1612 Unilateral primary osteoarthritis, left hip: Secondary | ICD-10-CM | POA: Diagnosis not present

## 2016-11-02 DIAGNOSIS — M25552 Pain in left hip: Secondary | ICD-10-CM | POA: Diagnosis not present

## 2016-11-04 DIAGNOSIS — M25552 Pain in left hip: Secondary | ICD-10-CM | POA: Diagnosis not present

## 2016-11-04 DIAGNOSIS — M1612 Unilateral primary osteoarthritis, left hip: Secondary | ICD-10-CM | POA: Diagnosis not present

## 2016-11-10 DIAGNOSIS — M1612 Unilateral primary osteoarthritis, left hip: Secondary | ICD-10-CM | POA: Diagnosis not present

## 2016-11-10 DIAGNOSIS — M25552 Pain in left hip: Secondary | ICD-10-CM | POA: Diagnosis not present

## 2016-11-12 ENCOUNTER — Ambulatory Visit (INDEPENDENT_AMBULATORY_CARE_PROVIDER_SITE_OTHER): Payer: PPO | Admitting: Cardiology

## 2016-11-12 ENCOUNTER — Encounter: Payer: Self-pay | Admitting: Cardiology

## 2016-11-12 VITALS — BP 120/64 | HR 68 | Resp 12 | Ht 68.0 in | Wt 243.0 lb

## 2016-11-12 DIAGNOSIS — E785 Hyperlipidemia, unspecified: Secondary | ICD-10-CM

## 2016-11-12 DIAGNOSIS — I10 Essential (primary) hypertension: Secondary | ICD-10-CM | POA: Diagnosis not present

## 2016-11-12 DIAGNOSIS — I70213 Atherosclerosis of native arteries of extremities with intermittent claudication, bilateral legs: Secondary | ICD-10-CM

## 2016-11-12 DIAGNOSIS — Z951 Presence of aortocoronary bypass graft: Secondary | ICD-10-CM | POA: Diagnosis not present

## 2016-11-12 DIAGNOSIS — I2510 Atherosclerotic heart disease of native coronary artery without angina pectoris: Secondary | ICD-10-CM | POA: Diagnosis not present

## 2016-11-12 DIAGNOSIS — E1151 Type 2 diabetes mellitus with diabetic peripheral angiopathy without gangrene: Secondary | ICD-10-CM

## 2016-11-12 NOTE — Progress Notes (Signed)
Cardiology Office Note:    Date:  11/12/2016   ID:  DAM ASHRAF, DOB [birth year], MRN [id number]  PCP:  Algis Greenhouse, MD  Cardiologist:  Jenne Campus, MD    Referring MD: Algis Greenhouse, MD   Chief Complaint  Patient presents with  . Follow-up  . Pre-op Exam    Had done by Dr. Olena Heckle  Scheduled to have surgery  History of Present Illness:    Tony King is a 71 y.o. male  with coronary artery disease and advanced peripheral vascular disease. Secondly he still continued to smoke. He is here today to talk about becoming hip surgery. Likely cardiac-wise he is doing well. Denies having any chest pain tightness squeezing pressure burning chest. Obviously his ability to walk is diminished because of his problem with the hip. He surgery schedule at the end of October. In the meantime he is given a trouble a lot he will go to [place] he will go to [place]. I told him that I would like to see him 2 weeks before surgery to make sure that he still stable with a talk about smoking I will call his primary care physician to get fasting lipid profile from him. There is some discrepancy about ejection fraction I will retrieve our old echocardiogram from our office to look at his ejection fraction. I will not change any of his medication right now.  Past Medical History:  Diagnosis Date  . Anginal pain (HCC)    hx of before CABG  . Arthritis   . Atherosclerosis of both lower extremities with intermittent claudication ([place])   . Complication of anesthesia    "insides did not start working after surgery"  . Coronary artery disease   . Diabetes mellitus without complication ([place])    type 2  . Dilated cardiomyopathy ([place])   . GERD (gastroesophageal reflux disease)   . H/O bronchitis   . H/O pleurisy 1968  . Headache    occasional  . Hypercholesteremia   . Hypertension    under control  . Pneumonia    hx of  . Shortness of breath   . Tobacco abuse     Past Surgical History:    Procedure Laterality Date  . APPENDECTOMY  ~age 64  . CARDIAC CATHETERIZATION     x3  . CHOLECYSTECTOMY  ~2013  . CORONARY ARTERY BYPASS GRAFT  2003   triple  . EXPLORATORY LAPAROTOMY  ~age 64   with appendectomy  . HERNIA REPAIR     with cholecystectomy  . JOINT REPLACEMENT Bilateral    knees  . TOTAL KNEE ARTHROPLASTY      Current Medications: Current Meds  Medication Sig  . aspirin EC 81 MG tablet Take 81 mg by mouth daily.  Marland Kitchen atorvastatin (LIPITOR) 10 MG tablet Take 10 mg by mouth daily.  . clopidogrel (PLAVIX) 75 MG tablet Take 75 mg by mouth daily.  . furosemide (LASIX) 40 MG tablet Take 40 mg by mouth 2 (two) times daily.   Marland Kitchen JARDIANCE 25 MG TABS tablet Take 1 tablet by mouth daily.  Marland Kitchen lisinopril (PRINIVIL,ZESTRIL) 40 MG tablet Take 40 mg by mouth every morning.  . metFORMIN (GLUCOPHAGE) 1000 MG tablet Take 1,000 mg by mouth at bedtime.  . metoprolol (LOPRESSOR) 50 MG tablet Take 50 mg by mouth 2 (two) times daily.   . Omega-3 Fatty Acids (FISH OIL) 1000 MG CAPS Take 2 capsules by mouth every morning. + 300 mg omega  . spironolactone (ALDACTONE)  25 MG tablet Take 25 mg by mouth daily.     Allergies:   Niacin and Oxycodone   Social History   Social History  . Marital status: Married    Spouse name: N/A  . Number of children: N/A  . Years of education: N/A   Social History Main Topics  . Smoking status: Current Every Day Smoker    Packs/day: 0.50    Years: 58.00    Types: Cigarettes  . Smokeless tobacco: Never Used  . Alcohol use Yes     Comment: occasional  . Drug use: No  . Sexual activity: Not Asked   Other Topics Concern  . None   Social History Narrative  . None     Family History: The patient's family history includes Cancer in his father; Diabetes in his mother; Heart disease in his maternal grandfather; Hypertension in his mother and sister. ROS:   Please see the history of present illness.    All 14 point review of systems negative except  as described per history of present illness  EKGs/Labs/Other Studies Reviewed:      Recent Labs: No results found for requested labs within last 8760 hours.  Recent Lipid Panel No results found for: CHOL, TRIG, HDL, CHOLHDL, VLDL, LDLCALC, LDLDIRECT  Physical Exam:    VS:  BP 120/64   Pulse 68   Resp 12   Ht 5\' 8"  (1.727 m)   Wt 243 lb (110.2 kg)   BMI 36.95 kg/m     Wt Readings from Last 3 Encounters:  11/12/16 243 lb (110.2 kg)  10/16/14 253 lb (114.8 kg)  07/03/09 (!) 277 lb (125.6 kg)     GEN:  Well nourished, well developed in no acute distress HEENT: Normal NECK: No JVD; No carotid bruits LYMPHATICS: No lymphadenopathy CARDIAC: RRR, no murmurs, no rubs, no gallops RESPIRATORY:  Clear to auscultation without rales, wheezing or rhonchi  ABDOMEN: Soft, non-tender, non-distended MUSCULOSKELETAL:  No edema; No deformity  SKIN: Warm and dry LOWER EXTREMITIES: no swelling NEUROLOGIC:  Alert and oriented x 3 PSYCHIATRIC:  Normal affect   ASSESSMENT:    1. Atherosclerosis of native artery of both lower extremities with intermittent claudication ([place])   2. Coronary artery disease involving native coronary artery of native heart without angina pectoris   3. Essential hypertension   4. Type 2 diabetes mellitus with diabetic peripheral angiopathy without gangrene, without long-term current use of insulin ([place])   5. Dyslipidemia   6. History of coronary artery bypass graft    PLAN:    In order of problems listed above:  1. Peripheral vascular disease: Follow-up by Dr. Leeroy Cha. Stable now 2. Coronary artery disease asymptomatic but ablations exercise very diminished. I would like to see him back in 3 weeks before surgery to make sure he still doing well. 3. Diabetes mellitus: Followed by primary care physician. 4. Dyslipidemia: Is an statin which I will continue local primary care physician to get fasting profile. 5. History of coronary artery bypass graft  stable.   Medication Adjustments/Labs and Tests Ordered: Current medicines are reviewed at length with the patient today.  Concerns regarding medicines are outlined above.  No orders of the defined types were placed in this encounter.  Medication changes: No orders of the defined types were placed in this encounter.   Signed, Park Liter, MD, [facility] 11/12/2016 8:54 AM    [place]

## 2016-11-12 NOTE — Patient Instructions (Signed)
Medication Instructions:  Your physician recommends that you continue on your current medications as directed. Please refer to the Current Medication list given to you today.  Labwork: None   Testing/Procedures: None   Follow-Up: Your physician recommends that you schedule a follow-up appointment in: 3 weeks prior to your appointment in October.    Any Other Special Instructions Will Be Listed Below (If Applicable).  Please note that any paperwork needing to be filled out by the provider will need to be addressed at the front desk prior to seeing the provider. Please note that any paperwork FMLA, Disability or other documents regarding health condition is subject to a $25.00 charge that must be received prior to completion of paperwork in the form of a money order or check.    If you need a refill on your cardiac medications before your next appointment, please call your pharmacy.

## 2016-12-18 NOTE — H&P (Signed)
TOTAL HIP ADMISSION H&P  Patient is admitted for left total hip arthroplasty, anterior approach.  Subjective:  Chief Complaint:     Left hip primary OA / pain  HPI: Tony King, 71 y.o. male, has a history of pain and functional disability in the left hip(s) due to arthritis and patient has failed non-surgical conservative treatments for greater than 12 weeks to include NSAID's and/or analgesics, corticosteriod injections and activity modification.  Onset of symptoms was gradual starting 3+ years ago with gradually worsening course since that time.The patient noted no past surgery on the right hip(s).  Patient currently rates pain in the right hip at 7 out of 10 with activity. Patient has night pain, worsening of pain with activity and weight bearing, trendelenberg gait, pain that interfers with activities of daily living and pain with passive range of motion. Patient has evidence of periarticular osteophytes and joint space narrowing by imaging studies. This condition presents safety issues increasing the risk of falls.   There is no current active infection.   Risks, benefits and expectations were discussed with the patient.  Risks including but not limited to the risk of anesthesia, blood clots, nerve damage, blood vessel damage, failure of the prosthesis, infection and up to and including death.  Patient understand the risks, benefits and expectations and wishes to proceed with surgery.   PCP: Algis Greenhouse, MD  D/C Plans:       Home  Post-op Meds:       No Rx given   Tranexamic Acid:      To be given - IV  Decadron:      Is to be given  FYI:     Plavix & ASA  Norco  DME:   Pt already has equipment   PT:   No PT   Patient Active Problem List   Diagnosis Date Noted  . Dilated cardiomyopathy ([place]) 06/10/2016  . Atherosclerosis of both lower extremities with intermittent claudication ([place]) 01/23/2015  . History of coronary artery bypass graft 01/23/2015  . Tobacco abuse  01/23/2015  . Localized edema 12/04/2014  . Abnormal stress test 10/24/2014  . Coronary artery disease involving native coronary artery of native heart without angina pectoris 09/27/2014  . Dyslipidemia 09/27/2014  . Essential hypertension 09/27/2014  . Type 2 diabetes mellitus with diabetic peripheral angiopathy without gangrene ([place]) 09/27/2014  . CAD 07/03/2009  . ESOPHAGEAL REFLUX 07/03/2009   Past Medical History:  Diagnosis Date  . Anginal pain (HCC)    hx of before CABG  . Arthritis   . Atherosclerosis of both lower extremities with intermittent claudication ([place])   . Complication of anesthesia    "insides did not start working after surgery"  . Coronary artery disease   . Diabetes mellitus without complication ([place])    type 2  . Dilated cardiomyopathy ([place])   . GERD (gastroesophageal reflux disease)   . H/O bronchitis   . H/O pleurisy 1968  . Headache    occasional  . Hypercholesteremia   . Hypertension    under control  . Pneumonia    hx of  . Shortness of breath   . Tobacco abuse     Past Surgical History:  Procedure Laterality Date  . APPENDECTOMY  ~age 65  . CARDIAC CATHETERIZATION     x3  . CHOLECYSTECTOMY  ~2013  . CORONARY ARTERY BYPASS GRAFT  2003   triple  . EXPLORATORY LAPAROTOMY  ~age 65   with appendectomy  . HERNIA REPAIR  with cholecystectomy  . JOINT REPLACEMENT Bilateral    knees  . TOTAL KNEE ARTHROPLASTY      No prescriptions prior to admission.   Allergies  Allergen Reactions  . Niacin Other (See Comments)  . Oxycodone Other (See Comments)    Hallucinations    Social History  Substance Use Topics  . Smoking status: Current Every Day Smoker    Packs/day: 0.50    Years: 58.00    Types: Cigarettes  . Smokeless tobacco: Never Used  . Alcohol use Yes     Comment: occasional    Family History  Problem Relation Age of Onset  . Hypertension Mother   . Diabetes Mother   . Cancer Father   . Hypertension Sister   . Heart  disease Maternal Grandfather      Review of Systems  Constitutional: Negative.   HENT: Negative.   Eyes: Negative.   Respiratory: Negative.   Cardiovascular: Negative.   Gastrointestinal: Positive for heartburn.  Genitourinary: Negative.   Musculoskeletal: Positive for joint pain.  Skin: Negative.   Neurological: Negative.   Endo/Heme/Allergies: Negative.   Psychiatric/Behavioral: Negative.     Objective:  Physical Exam  Constitutional: He is oriented to person, place, and time. He appears well-developed.  HENT:  Head: Normocephalic.  Eyes: Pupils are equal, round, and reactive to light.  Neck: Neck supple. No JVD present. No tracheal deviation present. No thyromegaly present.  Cardiovascular: Normal rate, regular rhythm and intact distal pulses.   Respiratory: Effort normal and breath sounds normal. No respiratory distress. He has no wheezes.  GI: Soft. There is no tenderness. There is no guarding.  Musculoskeletal:       Left hip: He exhibits decreased range of motion, decreased strength, tenderness and bony tenderness. He exhibits no swelling, no deformity and no laceration.  Lymphadenopathy:    He has no cervical adenopathy.  Neurological: He is alert and oriented to person, place, and time.  Skin: Skin is warm and dry.  Psychiatric: He has a normal mood and affect.      Labs:  Estimated body mass index is 36.95 kg/m as calculated from the following:   Height as of 11/12/16: 5\' 8"  (1.727 m).   Weight as of 11/12/16: 110.2 kg (243 lb).   Imaging Review Plain radiographs demonstrate severe degenerative joint disease of the left hip(s). The bone quality appears to be good for age and reported activity level.  Assessment/Plan:  End stage arthritis, left hip(s)  The patient history, physical examination, clinical judgement of the provider and imaging studies are consistent with end stage degenerative joint disease of the left hip(s) and total hip arthroplasty is  deemed medically necessary. The treatment options including medical management, injection therapy, arthroscopy and arthroplasty were discussed at length. The risks and benefits of total hip arthroplasty were presented and reviewed. The risks due to aseptic loosening, infection, stiffness, dislocation/subluxation,  thromboembolic complications and other imponderables were discussed.  The patient acknowledged the explanation, agreed to proceed with the plan and consent was signed. Patient is being admitted for inpatient treatment for surgery, pain control, PT, OT, prophylactic antibiotics, VTE prophylaxis, progressive ambulation and ADL's and discharge planning.The patient is planning to be discharged home.       [place] Yatzil Clippinger   PA-C  12/18/2016, 9:44 AM

## 2016-12-21 DIAGNOSIS — M1612 Unilateral primary osteoarthritis, left hip: Secondary | ICD-10-CM | POA: Insufficient documentation

## 2016-12-22 ENCOUNTER — Telehealth: Payer: Self-pay

## 2016-12-22 DIAGNOSIS — I2510 Atherosclerotic heart disease of native coronary artery without angina pectoris: Secondary | ICD-10-CM | POA: Diagnosis not present

## 2016-12-22 DIAGNOSIS — I10 Essential (primary) hypertension: Secondary | ICD-10-CM | POA: Diagnosis not present

## 2016-12-22 DIAGNOSIS — E119 Type 2 diabetes mellitus without complications: Secondary | ICD-10-CM | POA: Diagnosis not present

## 2016-12-22 DIAGNOSIS — I70213 Atherosclerosis of native arteries of extremities with intermittent claudication, bilateral legs: Secondary | ICD-10-CM | POA: Diagnosis not present

## 2016-12-22 DIAGNOSIS — I420 Dilated cardiomyopathy: Secondary | ICD-10-CM | POA: Diagnosis not present

## 2016-12-22 MED ORDER — LISINOPRIL 40 MG PO TABS: 40.0000 mg | ORAL_TABLET | Freq: Every morning | ORAL | 2 refills | Status: DC

## 2016-12-22 MED ORDER — METOPROLOL TARTRATE 50 MG PO TABS: 50.0000 mg | ORAL_TABLET | Freq: Two times a day (BID) | ORAL | 2 refills | Status: DC

## 2016-12-22 NOTE — Telephone Encounter (Signed)
Refill request faxed from pharmacy for Metoprolol. Refills have been provided

## 2016-12-22 NOTE — Telephone Encounter (Signed)
Refill on Lisinopril has been faxed from pharmacy. Refills have been provided.

## 2016-12-24 ENCOUNTER — Encounter: Payer: Self-pay | Admitting: Cardiology

## 2016-12-24 ENCOUNTER — Ambulatory Visit (INDEPENDENT_AMBULATORY_CARE_PROVIDER_SITE_OTHER): Payer: PPO | Admitting: Cardiology

## 2016-12-24 VITALS — BP 120/68 | HR 72 | Resp 12 | Ht 68.0 in | Wt 243.8 lb

## 2016-12-24 DIAGNOSIS — I2510 Atherosclerotic heart disease of native coronary artery without angina pectoris: Secondary | ICD-10-CM | POA: Diagnosis not present

## 2016-12-24 DIAGNOSIS — Z720 Tobacco use: Secondary | ICD-10-CM | POA: Diagnosis not present

## 2016-12-24 DIAGNOSIS — R600 Localized edema: Secondary | ICD-10-CM | POA: Diagnosis not present

## 2016-12-24 DIAGNOSIS — I10 Essential (primary) hypertension: Secondary | ICD-10-CM

## 2016-12-24 DIAGNOSIS — Z951 Presence of aortocoronary bypass graft: Secondary | ICD-10-CM

## 2016-12-24 DIAGNOSIS — E1151 Type 2 diabetes mellitus with diabetic peripheral angiopathy without gangrene: Secondary | ICD-10-CM | POA: Diagnosis not present

## 2016-12-24 DIAGNOSIS — Z23 Encounter for immunization: Secondary | ICD-10-CM | POA: Diagnosis not present

## 2016-12-24 DIAGNOSIS — I70213 Atherosclerosis of native arteries of extremities with intermittent claudication, bilateral legs: Secondary | ICD-10-CM | POA: Diagnosis not present

## 2016-12-24 DIAGNOSIS — E785 Hyperlipidemia, unspecified: Secondary | ICD-10-CM | POA: Diagnosis not present

## 2016-12-24 NOTE — Progress Notes (Signed)
Cardiology Office Note:    Date:  12/24/2016   ID:  Tony King, DOB Jul 31, 1945, MRN [id number]  PCP:  Tony Greenhouse, MD  Cardiologist:  Tony Campus, MD    Referring MD: Tony Greenhouse, MD   Chief Complaint  Patient presents with  . 6 week follow up  I'm getting ready to have my surgery  History of Present Illness:    Tony King is a 71 y.o. male  with multiple medical problems including coronary artery disease and advanced peripheral vascular disease. He is also diabetic. He is getting ready to hip surgery done and is here evaluated before that. In spite of pain in his left hip he still able to walk a lot no difficulty doing this. There is no chest pain tightness and squeezing or pressure no burning in her chest. He had stress test done in November which was negative however I still did not get A we'll write for a copy of the test. He did have echocardiogram done in March which showed diminished ejection fraction. I will ask him to repeat echocardiogram for that. EKG will be done today. He still gets significant swelling of lower extremities, I will check a proBNP as well as Chem-7. Try to intensify diuresis to make his legs to look better before surgery.  Past Medical History:  Diagnosis Date  . Anginal pain (HCC)    hx of before CABG  . Arthritis   . Atherosclerosis of both lower extremities with intermittent claudication ([place])   . Complication of anesthesia    "insides did not start working after surgery"  . Coronary artery disease   . Diabetes mellitus without complication ([place])    type 2  . Dilated cardiomyopathy ([place])   . GERD (gastroesophageal reflux disease)   . H/O bronchitis   . H/O pleurisy 1968  . Headache    occasional  . Hypercholesteremia   . Hypertension    under control  . Pneumonia    hx of  . Shortness of breath   . Tobacco abuse     Past Surgical History:  Procedure Laterality Date  . APPENDECTOMY  ~age 1  . CARDIAC  CATHETERIZATION     x3  . CHOLECYSTECTOMY  ~2013  . CORONARY ARTERY BYPASS GRAFT  2003   triple  . EXPLORATORY LAPAROTOMY  ~age 1   with appendectomy  . HERNIA REPAIR     with cholecystectomy  . JOINT REPLACEMENT Bilateral    knees  . TOTAL KNEE ARTHROPLASTY      Current Medications: Current Meds  Medication Sig  . aspirin EC 81 MG tablet Take 81 mg by mouth daily.  Marland Kitchen atorvastatin (LIPITOR) 10 MG tablet Take 10 mg by mouth daily.  . clopidogrel (PLAVIX) 75 MG tablet Take 75 mg by mouth daily.  . furosemide (LASIX) 40 MG tablet Take 40 mg by mouth 2 (two) times daily.   Marland Kitchen JARDIANCE 25 MG TABS tablet Take 1 tablet by mouth daily.  Marland Kitchen lisinopril (PRINIVIL,ZESTRIL) 40 MG tablet Take 1 tablet (40 mg total) by mouth every morning.  . metFORMIN (GLUCOPHAGE) 1000 MG tablet Take 1,000 mg by mouth at bedtime.  . metoprolol tartrate (LOPRESSOR) 50 MG tablet Take 1 tablet (50 mg total) by mouth 2 (two) times daily.  . Omega-3 Fatty Acids (FISH OIL) 1000 MG CAPS Take 2 capsules by mouth every morning. + 300 mg omega  . spironolactone (ALDACTONE) 25 MG tablet Take 25 mg by mouth daily.  Allergies:   Niacin and Oxycodone   Social History   Social History  . Marital status: Married    Spouse name: N/A  . Number of children: N/A  . Years of education: N/A   Social History Main Topics  . Smoking status: Current Every Day Smoker    Packs/day: 0.50    Years: 58.00    Types: Cigarettes  . Smokeless tobacco: Never Used  . Alcohol use Yes     Comment: occasional  . Drug use: No  . Sexual activity: Not Asked   Other Topics Concern  . None   Social History Narrative  . None     Family History: The patient's family history includes Cancer in his father; Diabetes in his mother; Heart disease in his maternal grandfather; Hypertension in his mother and sister. ROS:   Please see the history of present illness.    All 14 point review of systems negative except as described per  history of present illness  EKGs/Labs/Other Studies Reviewed:      Recent Labs: No results found for requested labs within last 8760 hours.  Recent Lipid Panel No results found for: CHOL, TRIG, HDL, CHOLHDL, VLDL, LDLCALC, LDLDIRECT  Physical Exam:    VS:  BP 120/68   Pulse 72   Resp 12   Ht 5\' 8"  (1.727 m)   Wt 243 lb 12.8 oz (110.6 kg)   BMI 37.07 kg/m     Wt Readings from Last 3 Encounters:  12/24/16 243 lb 12.8 oz (110.6 kg)  11/12/16 243 lb (110.2 kg)  10/16/14 253 lb (114.8 kg)     GEN:  Well nourished, well developed in no acute distress HEENT: Normal NECK: No JVD; No carotid bruits LYMPHATICS: No lymphadenopathy CARDIAC: RRR, no murmurs, no rubs, no gallops RESPIRATORY:  Clear to auscultation without rales, wheezing or rhonchi  ABDOMEN: Soft, non-tender, non-distended MUSCULOSKELETAL:  No edema; No deformity  SKIN: Warm and dry LOWER EXTREMITIES: 1+ swelling NEUROLOGIC:  Alert and oriented x 3 PSYCHIATRIC:  Normal affect   ASSESSMENT:    1. Atherosclerosis of native artery of both lower extremities with intermittent claudication ([place])   2. Coronary artery disease involving native coronary artery of native heart without angina pectoris   3. Essential hypertension   4. Type 2 diabetes mellitus with diabetic peripheral angiopathy without gangrene, without long-term current use of insulin ([place])   5. Dyslipidemia   6. History of coronary artery bypass graft   7. Tobacco abuse   8. Localized edema    PLAN:    In order of problems listed above:  1. Peripheral vascular disease: I reviewed notes from Dr. Olena King who saw him in May. It looked like there was no objections against surgery. He still gets completely occluded SFA bilaterally with minimal claudication therefore consented approach is important want to continue. Subsequently critically essential B stop smoking we talked in length about that. No significant but still continue to smoke. 2. Coronary artery  disease: Awaiting report of stress from last year if it's negative since he is asymptomatic and he we can proceed with surgery as scheduled. 3. Dyslipidemia: We'll continue with statin. 4. Tobacco: Obviously big issue him must stop. 5. Swelling of lower extremities: Will check his proBNP as well as Chem-7 to see if intensifies his diuretic.  Overall he is a gentleman with multiple medical problems admitted had his hip surgery done. We'll repeat echocardiogram EKG proBNP and Chem-7 assessing his risks.   Medication Adjustments/Labs and Tests  Ordered: Current medicines are reviewed at length with the patient today.  Concerns regarding medicines are outlined above.  No orders of the defined types were placed in this encounter.  Medication changes: No orders of the defined types were placed in this encounter.   Signed, Park Liter, MD, [facility] 12/24/2016 8:59 AM    [place]

## 2016-12-24 NOTE — Addendum Note (Signed)
Addended by: Kathyrn Sheriff on: 12/24/2016 09:20 AM   Modules accepted: Orders

## 2016-12-24 NOTE — Patient Instructions (Addendum)
Medication Instructions:  Your physician recommends that you continue on your current medications as directed. Please refer to the Current Medication list given to you today.  1. Avoid all over-the-counter antihistamines except Claritin/Loratadine and Zyrtec/Cetrizine. 2. Avoid all combination including cold sinus allergies flu decongestant and sleep medications 3. You can use Robitussin DM Mucinex and Mucinex DM for cough. 4. can use Tylenol aspirin ibuprofen and naproxen but no combinations such as sleep or sinus.  Labwork: Your physician recommends that you have lab work today to check your fluid level and Kidney function as well as sodium level and potassium.   Testing/Procedures: EKG today in office.   Your physician has requested that you have an echocardiogram. Echocardiography is a painless test that uses sound waves to create images of your heart. It provides your doctor with information about the size and shape of your heart and how well your heart's chambers and valves are working. This procedure takes approximately one hour. There are no restrictions for this procedure.   Follow-Up: Your physician recommends that you schedule a follow-up appointment in: 3 months   Any Other Special Instructions Will Be Listed Below (If Applicable).  Please note that any paperwork needing to be filled out by the provider will need to be addressed at the front desk prior to seeing the provider. Please note that any paperwork FMLA, Disability or other documents regarding health condition is subject to a $25.00 charge that must be received prior to completion of paperwork in the form of a money order or check.    If you need a refill on your cardiac medications before your next appointment, please call your pharmacy.

## 2016-12-25 ENCOUNTER — Other Ambulatory Visit: Payer: Self-pay

## 2016-12-25 LAB — BASIC METABOLIC PANEL
BUN/Creatinine Ratio: 26 — ABNORMAL HIGH (ref 10–24)
BUN: 25 mg/dL (ref 8–27)
CALCIUM: 9.4 mg/dL (ref 8.6–10.2)
CO2: 21 mmol/L (ref 20–29)
Chloride: 101 mmol/L (ref 96–106)
Creatinine, Ser: 0.96 mg/dL (ref 0.76–1.27)
GFR calc Af Amer: 91 mL/min/{1.73_m2} (ref >59)
GFR, EST NON AFRICAN AMERICAN: 79 mL/min/{1.73_m2} (ref >59)
GLUCOSE: 170 mg/dL — AB (ref 65–99)
POTASSIUM: 4.6 mmol/L (ref 3.5–5.2)
SODIUM: 138 mmol/L (ref 134–144)

## 2016-12-25 LAB — PRO B NATRIURETIC PEPTIDE: NT-PRO BNP: 294 pg/mL (ref 0–376)

## 2016-12-25 MED ORDER — FUROSEMIDE 40 MG PO TABS: ORAL_TABLET | ORAL | 11 refills | Status: AC

## 2016-12-29 ENCOUNTER — Other Ambulatory Visit: Payer: Self-pay

## 2016-12-29 MED ORDER — SPIRONOLACTONE 25 MG PO TABS: 25.0000 mg | ORAL_TABLET | Freq: Every day | ORAL | 9 refills | Status: DC

## 2017-01-04 ENCOUNTER — Other Ambulatory Visit (HOSPITAL_COMMUNITY): Payer: Self-pay | Admitting: Emergency Medicine

## 2017-01-04 DIAGNOSIS — I420 Dilated cardiomyopathy: Secondary | ICD-10-CM | POA: Diagnosis not present

## 2017-01-04 NOTE — Progress Notes (Signed)
[facility] cardiology Dr Drue Dun 12-24-16 epic [place] PCP Dr Garlon Hatchet 12-22-16 on chart   EKG 12-24-16 epic  Abdominal Aortic Aneurysm duplex on 12/21/2016   HgA1c 12-22-16 on chart   ProBNP and BMP 12-24-16 epic   ECHO 05-26-16 epic

## 2017-01-04 NOTE — Patient Instructions (Addendum)
Tony King [facility]  01/04/2017   Your procedure is scheduled on: 01-12-17  Report to [facility] Main  Entrance    Follow signs to Short Stay on first floor at 5:15AM  Call this number if you have problems the morning of surgery  [phone]   Remember: ONLY 1 PERSON MAY GO WITH YOU TO SHORT STAY TO GET  READY MORNING OF [place].  Do not eat food or drink liquids :After Midnight.     Take these medicines the morning of surgery with A SIP OF WATER: atorvastatin(lipitor), metoprolol                                You may not have any metal on your body including hair pins and              piercings  Do not wear jewelry, make-up, lotions, powders or perfumes, deodorant                       Men may shave face and neck.   Do not bring valuables to the hospital. [place].  Contacts, dentures or bridgework may not be worn into surgery.  Leave suitcase in the car. After surgery it may be brought to your room.                Please read over the following fact sheets you were given: _____________________________________________________________________           How to Manage Your Diabetes Before and After Surgery  Why is it important to control my blood sugar before and after surgery? . Improving blood sugar levels before and after surgery helps healing and can limit problems. . A way of improving blood sugar control is eating a healthy diet by: o  Eating less sugar and carbohydrates o  Increasing activity/exercise o  Talking with your doctor about reaching your blood sugar goals . High blood sugars (greater than 180 mg/dL) can raise your risk of infections and slow your recovery, so you will need to focus on controlling your diabetes during the weeks before surgery. . Make sure that the doctor who takes care of your diabetes knows about your planned surgery including the date and location.  How do I  manage my blood sugar before surgery? . Check your blood sugar at least 4 times a day, starting 2 days before surgery, to make sure that the level is not too high or low. o Check your blood sugar the morning of your surgery when you wake up and every 2 hours until you get to the Short Stay unit. . If your blood sugar is less than 70 mg/dL, you will need to treat for low blood sugar: o Do not take insulin. o Treat a low blood sugar (less than 70 mg/dL) with  cup of clear juice (cranberry or apple), 4 glucose tablets, OR glucose gel. o Recheck blood sugar in 15 minutes after treatment (to make sure it is greater than 70 mg/dL). If your blood sugar is not greater than 70 mg/dL on recheck, call [phone] for further instructions. . Report your blood sugar to the short stay nurse when you get to Short Stay.  . If  you are admitted to the hospital after surgery: o Your blood sugar will be checked by the staff and you will probably be given insulin after surgery (instead of oral diabetes medicines) to make sure you have good blood sugar levels. o The goal for blood sugar control after surgery is 80-180 mg/dL.   WHAT DO I DO ABOUT MY DIABETES MEDICATION?   [place] PROVIDER OR ENDOCRINOLOGIST ABOUT JARDIANCE INSTRUCTIONS FOR SURGERY!!  . THE DAY BEFORE SURGERY, take  METFORMIN as normal.      . THE MORNING OF SURGERY,DO NOT TAKE METFORMIN!   Patient Signature:  Date:   Nurse Signature:  Date:   Reviewed and Endorsed by [facility] Patient Education Committee, August 2015   [facility] - Preparing for Surgery Before surgery, you can play an important role.  Because skin is not sterile, your skin needs to be as free of germs as possible.  You can reduce the number of germs on your skin by washing with CHG (chlorahexidine gluconate) soap before surgery.  CHG is an antiseptic cleaner which kills germs and bonds with the skin to continue killing germs even after  washing. Please DO NOT use if you have an allergy to CHG or antibacterial soaps.  If your skin becomes reddened/irritated stop using the CHG and inform your nurse when you arrive at Short Stay. Do not shave (including legs and underarms) for at least 48 hours prior to the first CHG shower.  You may shave your face/neck. Please follow these instructions carefully:  1.  Shower with CHG Soap the night before surgery and the  morning of Surgery.  2.  If you choose to wash your hair, wash your hair first as usual with your  normal  shampoo.  3.  After you shampoo, rinse your hair and body thoroughly to remove the  shampoo.                           4.  Use CHG as you would any other liquid soap.  You can apply chg directly  to the skin and wash                       Gently with a scrungie or clean washcloth.  5.  Apply the CHG Soap to your body ONLY FROM THE NECK DOWN.   Do not use on face/ open                           Wound or open sores. Avoid contact with eyes, ears mouth and genitals (private parts).                       Wash face,  Genitals (private parts) with your normal soap.             6.  Wash thoroughly, paying special attention to the area where your surgery  will be performed.  7.  Thoroughly rinse your body with warm water from the neck down.  8.  DO NOT shower/wash with your normal soap after using and rinsing off  the CHG Soap.                9.  Pat yourself dry with a clean towel.            10.  Wear clean pajamas.  11.  Place clean sheets on your bed the night of your first shower and do not  sleep with pets. Day of Surgery : Do not apply any lotions/deodorants the morning of surgery.  Please wear clean clothes to the hospital/surgery center.  FAILURE TO FOLLOW THESE INSTRUCTIONS MAY RESULT IN THE CANCELLATION OF YOUR SURGERY PATIENT SIGNATURE_________________________________  NURSE  SIGNATURE__________________________________  ________________________________________________________________________   Tony King  An incentive spirometer is a tool that can help keep your lungs clear and active. This tool measures how well you are filling your lungs with each breath. Taking long deep breaths may help reverse or decrease the chance of developing breathing (pulmonary) problems (especially infection) following:  A long period of time when you are unable to move or be active. BEFORE THE PROCEDURE   If the spirometer includes an indicator to show your [place] effort, your nurse or respiratory therapist will set it to a desired goal.  If possible, sit up straight or lean slightly forward. Try not to slouch.  Hold the incentive spirometer in an upright position. INSTRUCTIONS FOR USE  1. Sit on the edge of your bed if possible, or sit up as far as you can in bed or on a chair. 2. Hold the incentive spirometer in an upright position. 3. Breathe out normally. 4. Place the mouthpiece in your mouth and seal your lips tightly around it. 5. Breathe in slowly and as deeply as possible, raising the piston or the ball toward the top of the column. 6. Hold your breath for 3-5 seconds or for as long as possible. Allow the piston or ball to fall to the bottom of the column. 7. Remove the mouthpiece from your mouth and breathe out normally. 8. Rest for a few seconds and repeat Steps 1 through 7 at least 10 times every 1-2 hours when you are awake. Take your time and take a few normal breaths between deep breaths. 9. The spirometer may include an indicator to show your [place] effort. Use the indicator as a goal to work toward during each repetition. 10. After each set of 10 deep breaths, practice coughing to be sure your lungs are clear. If you have an incision (the cut made at the time of surgery), support your incision when coughing by placing a pillow or rolled up towels firmly  against it. Once you are able to get out of bed, walk around indoors and cough well. You may stop using the incentive spirometer when instructed by your caregiver.  RISKS AND COMPLICATIONS  Take your time so you do not get dizzy or light-headed.  If you are in pain, you may need to take or ask for pain medication before doing incentive spirometry. It is harder to take a deep breath if you are having pain. AFTER USE  Rest and breathe slowly and easily.  It can be helpful to keep track of a log of your progress. Your caregiver can provide you with a simple table to help with this. If you are using the spirometer at home, follow these instructions: [place] IF:   You are having difficultly using the spirometer.  You have trouble using the spirometer as often as instructed.  Your pain medication is not giving enough relief while using the spirometer.  You develop fever of 100.5 F (38.1 C) or higher. SEEK IMMEDIATE MEDICAL CARE IF:   You cough up bloody sputum that had not been present before.  You develop fever of 102 F (38.9 C)  or greater.  You develop worsening pain at or near the incision site. MAKE SURE YOU:   Understand these instructions.  Will watch your condition.  Will get help right away if you are not doing well or get worse. Document Released: 07/20/2006 Document Revised: 06/01/2011 Document Reviewed: 09/20/2006 ExitCare Patient Information 2014 ExitCare, [place].   ________________________________________________________________________  WHAT IS A BLOOD TRANSFUSION? Blood Transfusion Information  A transfusion is the replacement of blood or some of its parts. Blood is made up of multiple cells which provide different functions.  Red blood cells carry oxygen and are used for blood loss replacement.  White blood cells fight against infection.  Platelets control bleeding.  Plasma helps clot blood.  Other blood products are available for  specialized needs, such as hemophilia or other clotting disorders. BEFORE THE TRANSFUSION  Who gives blood for transfusions?   Healthy volunteers who are fully evaluated to make sure their blood is safe. This is blood bank blood. Transfusion therapy is the safest it has ever been in the practice of medicine. Before blood is taken from a donor, a complete history is taken to make sure that person has no history of diseases nor engages in risky social behavior (examples are intravenous drug use or sexual activity with multiple partners). The donor's travel history is screened to minimize risk of transmitting infections, such as malaria. The donated blood is tested for signs of infectious diseases, such as HIV and hepatitis. The blood is then tested to be sure it is compatible with you in order to minimize the chance of a transfusion reaction. If you or a relative donates blood, this is often done in anticipation of surgery and is not appropriate for emergency situations. It takes many days to process the donated blood. RISKS AND COMPLICATIONS Although transfusion therapy is very safe and saves many lives, the main dangers of transfusion include:   Getting an infectious disease.  Developing a transfusion reaction. This is an allergic reaction to something in the blood you were given. Every precaution is taken to prevent this. The decision to have a blood transfusion has been considered carefully by your caregiver before blood is given. Blood is not given unless the benefits outweigh the risks. AFTER THE TRANSFUSION  Right after receiving a blood transfusion, you will usually feel much better and more energetic. This is especially true if your red blood cells have gotten low (anemic). The transfusion raises the level of the red blood cells which carry oxygen, and this usually causes an energy increase.  The nurse administering the transfusion will monitor you carefully for complications. HOME CARE  INSTRUCTIONS  No special instructions are needed after a transfusion. You may find your energy is better. Speak with your caregiver about any limitations on activity for underlying diseases you may have. SEEK MEDICAL CARE IF:   Your condition is not improving after your transfusion.  You develop redness or irritation at the intravenous (IV) site. SEEK IMMEDIATE MEDICAL CARE IF:  Any of the following symptoms occur over the next 12 hours:  Shaking chills.  You have a temperature by mouth above 102 F (38.9 C), not controlled by medicine.  Chest, back, or muscle pain.  People around you feel you are not acting correctly or are confused.  Shortness of breath or difficulty breathing.  Dizziness and fainting.  You get a rash or develop hives.  You have a decrease in urine output.  Your urine turns a dark color or changes to pink, red,  or brown. Any of the following symptoms occur over the next 10 days:  You have a temperature by mouth above 102 F (38.9 C), not controlled by medicine.  Shortness of breath.  Weakness after normal activity.  The white part of the eye turns yellow (jaundice).  You have a decrease in the amount of urine or are urinating less often.  Your urine turns a dark color or changes to pink, red, or brown. Document Released: 03/06/2000 Document Revised: 06/01/2011 Document Reviewed: 10/24/2007 [facility] Patient Information 2014 [place], [place].  _______________________________________________________________________

## 2017-01-05 ENCOUNTER — Other Ambulatory Visit: Payer: Self-pay

## 2017-01-05 ENCOUNTER — Encounter (HOSPITAL_COMMUNITY): Payer: Self-pay

## 2017-01-05 ENCOUNTER — Encounter (HOSPITAL_COMMUNITY)
Admission: RE | Admit: 2017-01-05 | Discharge: 2017-01-05 | Disposition: A | Discharge status: Home / Self Care | Payer: PPO | Source: Ambulatory Visit | Attending: Orthopedic Surgery | Admitting: Orthopedic Surgery

## 2017-01-05 ENCOUNTER — Telehealth: Payer: Self-pay

## 2017-01-05 DIAGNOSIS — M1612 Unilateral primary osteoarthritis, left hip: Secondary | ICD-10-CM | POA: Insufficient documentation

## 2017-01-05 DIAGNOSIS — K219 Gastro-esophageal reflux disease without esophagitis: Secondary | ICD-10-CM | POA: Diagnosis not present

## 2017-01-05 DIAGNOSIS — Z01812 Encounter for preprocedural laboratory examination: Secondary | ICD-10-CM | POA: Insufficient documentation

## 2017-01-05 DIAGNOSIS — I70213 Atherosclerosis of native arteries of extremities with intermittent claudication, bilateral legs: Secondary | ICD-10-CM | POA: Diagnosis not present

## 2017-01-05 DIAGNOSIS — Z951 Presence of aortocoronary bypass graft: Secondary | ICD-10-CM | POA: Diagnosis not present

## 2017-01-05 DIAGNOSIS — Z8249 Family history of ischemic heart disease and other diseases of the circulatory system: Secondary | ICD-10-CM | POA: Insufficient documentation

## 2017-01-05 DIAGNOSIS — Z9889 Other specified postprocedural states: Secondary | ICD-10-CM | POA: Diagnosis not present

## 2017-01-05 DIAGNOSIS — E1151 Type 2 diabetes mellitus with diabetic peripheral angiopathy without gangrene: Secondary | ICD-10-CM | POA: Insufficient documentation

## 2017-01-05 DIAGNOSIS — Z9049 Acquired absence of other specified parts of digestive tract: Secondary | ICD-10-CM | POA: Diagnosis not present

## 2017-01-05 DIAGNOSIS — Z96653 Presence of artificial knee joint, bilateral: Secondary | ICD-10-CM | POA: Diagnosis not present

## 2017-01-05 DIAGNOSIS — Z888 Allergy status to other drugs, medicaments and biological substances status: Secondary | ICD-10-CM | POA: Insufficient documentation

## 2017-01-05 DIAGNOSIS — R600 Localized edema: Secondary | ICD-10-CM

## 2017-01-05 DIAGNOSIS — E785 Hyperlipidemia, unspecified: Secondary | ICD-10-CM | POA: Diagnosis not present

## 2017-01-05 DIAGNOSIS — Z833 Family history of diabetes mellitus: Secondary | ICD-10-CM | POA: Diagnosis not present

## 2017-01-05 DIAGNOSIS — F17210 Nicotine dependence, cigarettes, uncomplicated: Secondary | ICD-10-CM | POA: Insufficient documentation

## 2017-01-05 DIAGNOSIS — I2510 Atherosclerotic heart disease of native coronary artery without angina pectoris: Secondary | ICD-10-CM | POA: Diagnosis not present

## 2017-01-05 DIAGNOSIS — Z809 Family history of malignant neoplasm, unspecified: Secondary | ICD-10-CM | POA: Diagnosis not present

## 2017-01-05 DIAGNOSIS — R609 Edema, unspecified: Secondary | ICD-10-CM | POA: Diagnosis not present

## 2017-01-05 LAB — CBC
HEMATOCRIT: 47.1 % (ref 39.0–52.0)
HEMOGLOBIN: 15.9 g/dL (ref 13.0–17.0)
MCH: 32.1 pg (ref 26.0–34.0)
MCHC: 33.8 g/dL (ref 30.0–36.0)
MCV: 95.2 fL (ref 78.0–100.0)
Platelets: 218 10*3/uL (ref 150–400)
RBC: 4.95 MIL/uL (ref 4.22–5.81)
RDW: 14.1 % (ref 11.5–15.5)
WBC: 8.7 10*3/uL (ref 4.0–10.5)

## 2017-01-05 LAB — SURGICAL PCR SCREEN
MRSA, PCR: NEGATIVE
STAPHYLOCOCCUS AUREUS: NEGATIVE

## 2017-01-05 LAB — GLUCOSE, CAPILLARY: GLUCOSE-CAPILLARY: 123 mg/dL — AB (ref 65–99)

## 2017-01-05 NOTE — Telephone Encounter (Signed)
S/w pt after discuss of anticoagulation prior to surgery with Dr. Geraldo Pitter. Dr. Geraldo Pitter suggest pt can hold plavix x7 days however should continue with 81 mg ASA daily if possible. When I s/w pt he was currently at  Pre-op appointment for the surgery at which I have advised him of the recommendations and encouraged him to call me back and let me know if they are ok with the continuation of ASA. Pt was advised that Dr. Agustin Cree is out of the office until next week however I s/w him yesterday regarding the clearance for surgery and he advised pt is ok as long as the echo is normal. Echo has been reviewed and EF has improved from previous. Pt advised that Dr. Agustin Cree will not be able to sign the clearance form until Monday at which I will fax at that time. I have attempted to contact surgery scheduling at [place] to discuss this however have been unable to reach anyone.

## 2017-01-12 ENCOUNTER — Encounter (HOSPITAL_COMMUNITY): Payer: Self-pay

## 2017-01-12 ENCOUNTER — Inpatient Hospital Stay (HOSPITAL_COMMUNITY)
Admission: RE | Admit: 2017-01-12 | Discharge: 2017-01-13 | DRG: 470 | Disposition: A | Discharge status: Home / Self Care | Payer: PPO | Source: Ambulatory Visit | Attending: Orthopedic Surgery | Admitting: Orthopedic Surgery

## 2017-01-12 ENCOUNTER — Inpatient Hospital Stay (HOSPITAL_COMMUNITY): Payer: PPO

## 2017-01-12 ENCOUNTER — Encounter (HOSPITAL_COMMUNITY)
Admission: RE | Disposition: A | Discharge status: Home / Self Care | Payer: Self-pay | Source: Ambulatory Visit | Attending: Orthopedic Surgery

## 2017-01-12 ENCOUNTER — Inpatient Hospital Stay (HOSPITAL_COMMUNITY): Payer: PPO | Admitting: Anesthesiology

## 2017-01-12 DIAGNOSIS — M1612 Unilateral primary osteoarthritis, left hip: Secondary | ICD-10-CM | POA: Diagnosis not present

## 2017-01-12 DIAGNOSIS — Z6836 Body mass index (BMI) 36.0-36.9, adult: Secondary | ICD-10-CM | POA: Diagnosis not present

## 2017-01-12 DIAGNOSIS — E669 Obesity, unspecified: Secondary | ICD-10-CM | POA: Diagnosis not present

## 2017-01-12 DIAGNOSIS — M25552 Pain in left hip: Secondary | ICD-10-CM | POA: Diagnosis present

## 2017-01-12 DIAGNOSIS — F17210 Nicotine dependence, cigarettes, uncomplicated: Secondary | ICD-10-CM | POA: Diagnosis present

## 2017-01-12 DIAGNOSIS — I2510 Atherosclerotic heart disease of native coronary artery without angina pectoris: Secondary | ICD-10-CM | POA: Diagnosis present

## 2017-01-12 DIAGNOSIS — Z96649 Presence of unspecified artificial hip joint: Secondary | ICD-10-CM

## 2017-01-12 DIAGNOSIS — E1151 Type 2 diabetes mellitus with diabetic peripheral angiopathy without gangrene: Secondary | ICD-10-CM | POA: Diagnosis present

## 2017-01-12 DIAGNOSIS — K219 Gastro-esophageal reflux disease without esophagitis: Secondary | ICD-10-CM | POA: Diagnosis not present

## 2017-01-12 DIAGNOSIS — Z951 Presence of aortocoronary bypass graft: Secondary | ICD-10-CM | POA: Diagnosis not present

## 2017-01-12 DIAGNOSIS — Z96653 Presence of artificial knee joint, bilateral: Secondary | ICD-10-CM | POA: Diagnosis not present

## 2017-01-12 DIAGNOSIS — Z471 Aftercare following joint replacement surgery: Secondary | ICD-10-CM | POA: Diagnosis not present

## 2017-01-12 DIAGNOSIS — E7800 Pure hypercholesterolemia, unspecified: Secondary | ICD-10-CM | POA: Diagnosis present

## 2017-01-12 DIAGNOSIS — I10 Essential (primary) hypertension: Secondary | ICD-10-CM | POA: Diagnosis present

## 2017-01-12 DIAGNOSIS — I25810 Atherosclerosis of coronary artery bypass graft(s) without angina pectoris: Secondary | ICD-10-CM | POA: Diagnosis not present

## 2017-01-12 DIAGNOSIS — E119 Type 2 diabetes mellitus without complications: Secondary | ICD-10-CM | POA: Diagnosis not present

## 2017-01-12 DIAGNOSIS — Z96642 Presence of left artificial hip joint: Secondary | ICD-10-CM | POA: Diagnosis not present

## 2017-01-12 DIAGNOSIS — I420 Dilated cardiomyopathy: Secondary | ICD-10-CM | POA: Diagnosis not present

## 2017-01-12 HISTORY — PX: TOTAL HIP ARTHROPLASTY: SHX124

## 2017-01-12 LAB — TYPE AND SCREEN
ABO/RH(D): A POS
Antibody Screen: NEGATIVE

## 2017-01-12 LAB — GLUCOSE, CAPILLARY
GLUCOSE-CAPILLARY: 153 mg/dL — AB (ref 65–99)
GLUCOSE-CAPILLARY: 170 mg/dL — AB (ref 65–99)
GLUCOSE-CAPILLARY: 191 mg/dL — AB (ref 65–99)
GLUCOSE-CAPILLARY: 193 mg/dL — AB (ref 65–99)
Glucose-Capillary: 122 mg/dL — ABNORMAL HIGH (ref 65–99)

## 2017-01-12 SURGERY — ARTHROPLASTY, HIP, TOTAL, ANTERIOR APPROACH
Anesthesia: Spinal | Site: Hip | Laterality: Left

## 2017-01-12 MED ORDER — SUGAMMADEX SODIUM 500 MG/5ML IV SOLN
INTRAVENOUS | Status: AC
  Filled 2017-01-12: qty 5

## 2017-01-12 MED ORDER — DOCUSATE SODIUM 100 MG PO CAPS: 100.0000 mg | ORAL_CAPSULE | Freq: Two times a day (BID) | ORAL | 0 refills | Status: AC

## 2017-01-12 MED ORDER — FUROSEMIDE 40 MG PO TABS
40.0000 mg | ORAL_TABLET | Freq: Every day | ORAL | Status: DC
  Administered 2017-01-12: 17:00:00 40 mg via ORAL
  Filled 2017-01-12: qty 1

## 2017-01-12 MED ORDER — METHOCARBAMOL 500 MG PO TABS: 500.0000 mg | ORAL_TABLET | Freq: Four times a day (QID) | ORAL | Status: DC | PRN

## 2017-01-12 MED ORDER — CHLORHEXIDINE GLUCONATE 4 % EX LIQD: 60.0000 mL | Freq: Once | CUTANEOUS | Status: DC

## 2017-01-12 MED ORDER — CLOPIDOGREL BISULFATE 75 MG PO TABS
75.0000 mg | ORAL_TABLET | Freq: Every day | ORAL | Status: DC
  Administered 2017-01-13: 75 mg via ORAL
  Filled 2017-01-12: qty 1

## 2017-01-12 MED ORDER — ONDANSETRON HCL 4 MG PO TABS: 4.0000 mg | ORAL_TABLET | Freq: Four times a day (QID) | ORAL | Status: DC | PRN

## 2017-01-12 MED ORDER — CANAGLIFLOZIN 100 MG PO TABS
100.0000 mg | ORAL_TABLET | Freq: Every day | ORAL | Status: DC
  Administered 2017-01-13: 09:00:00 100 mg via ORAL
  Filled 2017-01-12: qty 1

## 2017-01-12 MED ORDER — SUGAMMADEX SODIUM 500 MG/5ML IV SOLN
INTRAVENOUS | Status: DC | PRN
  Administered 2017-01-12: 300 mg via INTRAVENOUS

## 2017-01-12 MED ORDER — FUROSEMIDE 40 MG PO TABS: 40.0000 mg | ORAL_TABLET | ORAL | Status: DC

## 2017-01-12 MED ORDER — HYDROCODONE-ACETAMINOPHEN 7.5-325 MG PO TABS
2.0000 | ORAL_TABLET | ORAL | Status: DC | PRN
  Filled 2017-01-12: qty 2

## 2017-01-12 MED ORDER — MAGNESIUM CITRATE PO SOLN: 1.0000 | Freq: Once | ORAL | Status: DC | PRN

## 2017-01-12 MED ORDER — MENTHOL 3 MG MT LOZG
1.0000 | LOZENGE | OROMUCOSAL | Status: DC | PRN
  Administered 2017-01-12: 12:00:00 3 mg via ORAL
  Filled 2017-01-12: qty 9

## 2017-01-12 MED ORDER — SODIUM CHLORIDE 0.9 % IR SOLN
Status: DC | PRN
  Administered 2017-01-12: 1000 mL

## 2017-01-12 MED ORDER — SODIUM CHLORIDE 0.9 % IV SOLN
INTRAVENOUS | Status: DC
Start: 2017-01-12 — End: 2017-01-13
  Administered 2017-01-12 – 2017-01-13 (×2): via INTRAVENOUS

## 2017-01-12 MED ORDER — HYDROMORPHONE HCL 1 MG/ML IJ SOLN
INTRAMUSCULAR | Status: AC
  Filled 2017-01-12: qty 1

## 2017-01-12 MED ORDER — FENTANYL CITRATE (PF) 100 MCG/2ML IJ SOLN
INTRAMUSCULAR | Status: DC | PRN
  Administered 2017-01-12 (×4): 50 ug via INTRAVENOUS

## 2017-01-12 MED ORDER — HYDROCODONE-ACETAMINOPHEN 7.5-325 MG PO TABS: 1.0000 | ORAL_TABLET | ORAL | 0 refills | Status: DC | PRN

## 2017-01-12 MED ORDER — PHENYLEPHRINE HCL 10 MG/ML IJ SOLN
INTRAMUSCULAR | Status: DC | PRN
  Administered 2017-01-12: 50 ug/min via INTRAVENOUS

## 2017-01-12 MED ORDER — KETAMINE HCL-SODIUM CHLORIDE 100-0.9 MG/10ML-% IV SOSY
PREFILLED_SYRINGE | INTRAVENOUS | Status: AC
  Filled 2017-01-12: qty 10

## 2017-01-12 MED ORDER — ONDANSETRON HCL 4 MG/2ML IJ SOLN: 4.0000 mg | Freq: Four times a day (QID) | INTRAMUSCULAR | Status: DC | PRN

## 2017-01-12 MED ORDER — METOCLOPRAMIDE HCL 5 MG/ML IJ SOLN
INTRAMUSCULAR | Status: AC
  Filled 2017-01-12: qty 2

## 2017-01-12 MED ORDER — PROMETHAZINE HCL 25 MG/ML IJ SOLN
6.2500 mg | INTRAMUSCULAR | Status: DC | PRN
Start: 2017-01-12 — End: 2017-01-12

## 2017-01-12 MED ORDER — ROCURONIUM BROMIDE 50 MG/5ML IV SOSY
PREFILLED_SYRINGE | INTRAVENOUS | Status: DC | PRN
  Administered 2017-01-12: 10 mg via INTRAVENOUS
  Administered 2017-01-12: 50 mg via INTRAVENOUS
  Administered 2017-01-12: 20 mg via INTRAVENOUS

## 2017-01-12 MED ORDER — PROPOFOL 10 MG/ML IV BOLUS
INTRAVENOUS | Status: AC
  Filled 2017-01-12: qty 60

## 2017-01-12 MED ORDER — ASPIRIN EC 81 MG PO TBEC
81.0000 mg | DELAYED_RELEASE_TABLET | Freq: Every day | ORAL | Status: DC
Start: 2017-01-13 — End: 2017-01-13
  Administered 2017-01-13: 09:00:00 81 mg via ORAL
  Filled 2017-01-12: qty 1

## 2017-01-12 MED ORDER — ACETAMINOPHEN 325 MG PO TABS: 650.0000 mg | ORAL_TABLET | ORAL | Status: DC | PRN

## 2017-01-12 MED ORDER — HYDROMORPHONE HCL 1 MG/ML IJ SOLN
0.2500 mg | INTRAMUSCULAR | Status: DC | PRN
  Administered 2017-01-12 (×2): 0.5 mg via INTRAVENOUS

## 2017-01-12 MED ORDER — LIDOCAINE 2% (20 MG/ML) 5 ML SYRINGE
INTRAMUSCULAR | Status: DC | PRN
  Administered 2017-01-12: 80 mg via INTRAVENOUS

## 2017-01-12 MED ORDER — METOCLOPRAMIDE HCL 5 MG PO TABS: 5.0000 mg | ORAL_TABLET | Freq: Three times a day (TID) | ORAL | Status: DC | PRN

## 2017-01-12 MED ORDER — DEXAMETHASONE SODIUM PHOSPHATE 10 MG/ML IJ SOLN
10.0000 mg | Freq: Once | INTRAMUSCULAR | Status: AC
  Administered 2017-01-12: 10 mg via INTRAVENOUS

## 2017-01-12 MED ORDER — LIDOCAINE 2% (20 MG/ML) 5 ML SYRINGE
INTRAMUSCULAR | Status: AC
  Filled 2017-01-12: qty 15

## 2017-01-12 MED ORDER — FERROUS SULFATE 325 (65 FE) MG PO TABS
325.0000 mg | ORAL_TABLET | Freq: Three times a day (TID) | ORAL | Status: DC
  Administered 2017-01-13 (×2): 325 mg via ORAL
  Filled 2017-01-12 (×2): qty 1

## 2017-01-12 MED ORDER — METOPROLOL TARTRATE 50 MG PO TABS
50.0000 mg | ORAL_TABLET | Freq: Two times a day (BID) | ORAL | Status: DC
  Administered 2017-01-12 – 2017-01-13 (×3): 50 mg via ORAL
  Filled 2017-01-12 (×3): qty 1

## 2017-01-12 MED ORDER — CEFAZOLIN SODIUM-DEXTROSE 2-4 GM/100ML-% IV SOLN
2.0000 g | INTRAVENOUS | Status: AC
  Administered 2017-01-12: 2 g via INTRAVENOUS

## 2017-01-12 MED ORDER — HYDROMORPHONE HCL 2 MG/ML IJ SOLN
INTRAMUSCULAR | Status: AC
  Filled 2017-01-12: qty 1

## 2017-01-12 MED ORDER — MIDAZOLAM HCL 5 MG/5ML IJ SOLN
INTRAMUSCULAR | Status: DC | PRN
  Administered 2017-01-12: 2 mg via INTRAVENOUS

## 2017-01-12 MED ORDER — PHENYLEPHRINE 40 MCG/ML (10ML) SYRINGE FOR IV PUSH (FOR BLOOD PRESSURE SUPPORT)
PREFILLED_SYRINGE | INTRAVENOUS | Status: AC
  Filled 2017-01-12: qty 20

## 2017-01-12 MED ORDER — ROCURONIUM BROMIDE 50 MG/5ML IV SOSY
PREFILLED_SYRINGE | INTRAVENOUS | Status: AC
  Filled 2017-01-12: qty 10

## 2017-01-12 MED ORDER — INSULIN ASPART 100 UNIT/ML ~~LOC~~ SOLN
0.0000 [IU] | Freq: Three times a day (TID) | SUBCUTANEOUS | Status: DC
  Administered 2017-01-12 (×2): 3 [IU] via SUBCUTANEOUS
  Administered 2017-01-13: 09:00:00 2 [IU] via SUBCUTANEOUS
  Administered 2017-01-13: 12:00:00 3 [IU] via SUBCUTANEOUS

## 2017-01-12 MED ORDER — LACTATED RINGERS IV SOLN
INTRAVENOUS | Status: DC
  Administered 2017-01-12: 1000 mL via INTRAVENOUS
  Administered 2017-01-12: 08:00:00 via INTRAVENOUS

## 2017-01-12 MED ORDER — DEXAMETHASONE SODIUM PHOSPHATE 10 MG/ML IJ SOLN
INTRAMUSCULAR | Status: AC
  Filled 2017-01-12: qty 1

## 2017-01-12 MED ORDER — DOCUSATE SODIUM 100 MG PO CAPS
100.0000 mg | ORAL_CAPSULE | Freq: Two times a day (BID) | ORAL | Status: DC
  Administered 2017-01-12 – 2017-01-13 (×2): 100 mg via ORAL
  Filled 2017-01-12 (×2): qty 1

## 2017-01-12 MED ORDER — PROPOFOL 10 MG/ML IV BOLUS
INTRAVENOUS | Status: DC | PRN
  Administered 2017-01-12: 150 mg via INTRAVENOUS

## 2017-01-12 MED ORDER — ONDANSETRON HCL 4 MG/2ML IJ SOLN
INTRAMUSCULAR | Status: DC | PRN
  Administered 2017-01-12: 4 mg via INTRAVENOUS

## 2017-01-12 MED ORDER — MIDAZOLAM HCL 2 MG/2ML IJ SOLN
INTRAMUSCULAR | Status: AC
  Filled 2017-01-12: qty 2

## 2017-01-12 MED ORDER — DEXAMETHASONE SODIUM PHOSPHATE 10 MG/ML IJ SOLN
10.0000 mg | Freq: Once | INTRAMUSCULAR | Status: AC
  Administered 2017-01-13: 10 mg via INTRAVENOUS
  Filled 2017-01-12: qty 1

## 2017-01-12 MED ORDER — HYDROMORPHONE HCL 1 MG/ML IJ SOLN: 0.5000 mg | INTRAMUSCULAR | Status: DC | PRN

## 2017-01-12 MED ORDER — KETAMINE HCL 10 MG/ML IJ SOLN
INTRAMUSCULAR | Status: DC | PRN
  Administered 2017-01-12 (×2): 30 mg via INTRAVENOUS

## 2017-01-12 MED ORDER — CELECOXIB 200 MG PO CAPS
200.0000 mg | ORAL_CAPSULE | Freq: Two times a day (BID) | ORAL | Status: DC
  Administered 2017-01-12 – 2017-01-13 (×2): 200 mg via ORAL
  Filled 2017-01-12 (×2): qty 1

## 2017-01-12 MED ORDER — METOCLOPRAMIDE HCL 5 MG/ML IJ SOLN
INTRAMUSCULAR | Status: DC | PRN
  Administered 2017-01-12: 10 mg via INTRAVENOUS

## 2017-01-12 MED ORDER — FUROSEMIDE 40 MG PO TABS
80.0000 mg | ORAL_TABLET | Freq: Every day | ORAL | Status: DC
  Administered 2017-01-13: 80 mg via ORAL
  Filled 2017-01-12: qty 2

## 2017-01-12 MED ORDER — LIDOCAINE 2% (20 MG/ML) 5 ML SYRINGE
INTRAMUSCULAR | Status: DC | PRN
  Administered 2017-01-12: 1.5 mg/kg/h via INTRAVENOUS

## 2017-01-12 MED ORDER — STERILE WATER FOR IRRIGATION IR SOLN
Status: DC | PRN
  Administered 2017-01-12: 3000 mL

## 2017-01-12 MED ORDER — POLYETHYLENE GLYCOL 3350 17 G PO PACK
17.0000 g | PACK | Freq: Two times a day (BID) | ORAL | Status: DC
  Administered 2017-01-12: 17 g via ORAL
  Filled 2017-01-12 (×2): qty 1

## 2017-01-12 MED ORDER — SPIRONOLACTONE 25 MG PO TABS
25.0000 mg | ORAL_TABLET | Freq: Every day | ORAL | Status: DC
  Administered 2017-01-12 – 2017-01-13 (×2): 25 mg via ORAL
  Filled 2017-01-12 (×2): qty 1

## 2017-01-12 MED ORDER — TRANEXAMIC ACID 1000 MG/10ML IV SOLN
1000.0000 mg | Freq: Once | INTRAVENOUS | Status: AC
  Administered 2017-01-12: 13:00:00 1000 mg via INTRAVENOUS
  Filled 2017-01-12: qty 1100

## 2017-01-12 MED ORDER — BISACODYL 10 MG RE SUPP: 10.0000 mg | Freq: Every day | RECTAL | Status: DC | PRN

## 2017-01-12 MED ORDER — PHENYLEPHRINE 40 MCG/ML (10ML) SYRINGE FOR IV PUSH (FOR BLOOD PRESSURE SUPPORT)
PREFILLED_SYRINGE | INTRAVENOUS | Status: DC | PRN
  Administered 2017-01-12: 80 ug via INTRAVENOUS
  Administered 2017-01-12: 120 ug via INTRAVENOUS
  Administered 2017-01-12 (×4): 80 ug via INTRAVENOUS

## 2017-01-12 MED ORDER — METHOCARBAMOL 1000 MG/10ML IJ SOLN
500.0000 mg | Freq: Four times a day (QID) | INTRAVENOUS | Status: DC | PRN
  Administered 2017-01-12: 500 mg via INTRAVENOUS
  Filled 2017-01-12: qty 550

## 2017-01-12 MED ORDER — PHENYLEPHRINE HCL 10 MG/ML IJ SOLN
INTRAMUSCULAR | Status: AC
  Filled 2017-01-12: qty 1

## 2017-01-12 MED ORDER — METFORMIN HCL 500 MG PO TABS
1000.0000 mg | ORAL_TABLET | Freq: Every day | ORAL | Status: DC
  Administered 2017-01-12: 1000 mg via ORAL
  Filled 2017-01-12: qty 2

## 2017-01-12 MED ORDER — ONDANSETRON HCL 4 MG/2ML IJ SOLN
INTRAMUSCULAR | Status: AC
  Filled 2017-01-12: qty 2

## 2017-01-12 MED ORDER — CEFAZOLIN SODIUM-DEXTROSE 2-4 GM/100ML-% IV SOLN
2.0000 g | Freq: Four times a day (QID) | INTRAVENOUS | Status: AC
  Administered 2017-01-12 (×2): 2 g via INTRAVENOUS
  Filled 2017-01-12 (×2): qty 100

## 2017-01-12 MED ORDER — ALUM & MAG HYDROXIDE-SIMETH 200-200-20 MG/5ML PO SUSP: 15.0000 mL | ORAL | Status: DC | PRN

## 2017-01-12 MED ORDER — METHOCARBAMOL 500 MG PO TABS: 500.0000 mg | ORAL_TABLET | Freq: Four times a day (QID) | ORAL | 0 refills | Status: DC | PRN

## 2017-01-12 MED ORDER — FERROUS SULFATE 325 (65 FE) MG PO TABS: 325.0000 mg | ORAL_TABLET | Freq: Three times a day (TID) | ORAL | 3 refills | Status: DC

## 2017-01-12 MED ORDER — CEFAZOLIN SODIUM-DEXTROSE 2-4 GM/100ML-% IV SOLN
INTRAVENOUS | Status: AC
  Filled 2017-01-12: qty 100

## 2017-01-12 MED ORDER — PHENOL 1.4 % MT LIQD: 1.0000 | OROMUCOSAL | Status: DC | PRN

## 2017-01-12 MED ORDER — TRANEXAMIC ACID 1000 MG/10ML IV SOLN
1000.0000 mg | INTRAVENOUS | Status: AC
  Administered 2017-01-12: 1000 mg via INTRAVENOUS
  Filled 2017-01-12: qty 1100

## 2017-01-12 MED ORDER — POLYETHYLENE GLYCOL 3350 17 G PO PACK: 17.0000 g | PACK | Freq: Two times a day (BID) | ORAL | 0 refills | Status: AC

## 2017-01-12 MED ORDER — FENTANYL CITRATE (PF) 250 MCG/5ML IJ SOLN
INTRAMUSCULAR | Status: AC
  Filled 2017-01-12: qty 5

## 2017-01-12 MED ORDER — DIPHENHYDRAMINE HCL 25 MG PO CAPS: 25.0000 mg | ORAL_CAPSULE | Freq: Four times a day (QID) | ORAL | Status: DC | PRN

## 2017-01-12 MED ORDER — HYDROCODONE-ACETAMINOPHEN 7.5-325 MG PO TABS
1.0000 | ORAL_TABLET | ORAL | Status: DC | PRN
  Administered 2017-01-12 – 2017-01-13 (×5): 1 via ORAL
  Filled 2017-01-12 (×5): qty 1

## 2017-01-12 MED ORDER — METOCLOPRAMIDE HCL 5 MG/ML IJ SOLN: 5.0000 mg | Freq: Three times a day (TID) | INTRAMUSCULAR | Status: DC | PRN

## 2017-01-12 MED ORDER — ACETAMINOPHEN 650 MG RE SUPP: 650.0000 mg | RECTAL | Status: DC | PRN

## 2017-01-12 SURGICAL SUPPLY — 37 items
ADH SKN CLS APL DERMABOND .7 (GAUZE/BANDAGES/DRESSINGS) ×1
BAG SPEC THK2 15X12 ZIP CLS (MISCELLANEOUS)
BAG ZIPLOCK 12X15 (MISCELLANEOUS) IMPLANT
BLADE SAG 18X100X1.27 (BLADE) ×3 IMPLANT
CAPT HIP TOTAL 2 ×2 IMPLANT
CLOTH BEACON ORANGE TIMEOUT ST (SAFETY) ×3 IMPLANT
COVER PERINEAL POST (MISCELLANEOUS) ×3 IMPLANT
COVER SURGICAL LIGHT HANDLE (MISCELLANEOUS) ×3 IMPLANT
DERMABOND ADVANCED (GAUZE/BANDAGES/DRESSINGS) ×2
DERMABOND ADVANCED .7 DNX12 (GAUZE/BANDAGES/DRESSINGS) ×1 IMPLANT
DRAPE STERI IOBAN 125X83 (DRAPES) ×3 IMPLANT
DRAPE U-SHAPE 47X51 STRL (DRAPES) ×6 IMPLANT
DRESSING AQUACEL AG SP 3.5X10 (GAUZE/BANDAGES/DRESSINGS) ×1 IMPLANT
DRSG AQUACEL AG ADV 3.5X10 (GAUZE/BANDAGES/DRESSINGS) ×2 IMPLANT
DRSG AQUACEL AG SP 3.5X10 (GAUZE/BANDAGES/DRESSINGS) ×3
DURAPREP 26ML APPLICATOR (WOUND CARE) ×3 IMPLANT
ELECT REM PT RETURN 15FT ADLT (MISCELLANEOUS) ×3 IMPLANT
GLOVE BIOGEL M STRL SZ7.5 (GLOVE) IMPLANT
GLOVE BIOGEL PI IND STRL 7.5 (GLOVE) ×1 IMPLANT
GLOVE BIOGEL PI IND STRL 8.5 (GLOVE) ×1 IMPLANT
GLOVE BIOGEL PI INDICATOR 7.5 (GLOVE) ×2
GLOVE BIOGEL PI INDICATOR 8.5 (GLOVE) ×2
GLOVE ECLIPSE 8.0 STRL XLNG CF (GLOVE) ×8 IMPLANT
GLOVE ORTHO TXT STRL SZ7.5 (GLOVE) ×5 IMPLANT
GOWN STRL REUS W/TWL LRG LVL3 (GOWN DISPOSABLE) ×5 IMPLANT
GOWN STRL REUS W/TWL XL LVL3 (GOWN DISPOSABLE) ×5 IMPLANT
HOLDER FOLEY CATH W/STRAP (MISCELLANEOUS) ×3 IMPLANT
PACK ANTERIOR HIP CUSTOM (KITS) ×3 IMPLANT
SUT MNCRL AB 4-0 PS2 18 (SUTURE) ×3 IMPLANT
SUT STRATAFIX 0 PDS 27 VIOLET (SUTURE) ×3
SUT VIC AB 1 CT1 36 (SUTURE) ×9 IMPLANT
SUT VIC AB 2-0 CT1 27 (SUTURE) ×6
SUT VIC AB 2-0 CT1 TAPERPNT 27 (SUTURE) ×2 IMPLANT
SUTURE STRATFX 0 PDS 27 VIOLET (SUTURE) ×1 IMPLANT
TRAY FOLEY W/METER SILVER 16FR (SET/KITS/TRAYS/PACK) IMPLANT
WATER STERILE IRR 1500ML POUR (IV SOLUTION) ×5 IMPLANT
YANKAUER SUCT BULB TIP 10FT TU (MISCELLANEOUS) IMPLANT

## 2017-01-12 NOTE — Discharge Instructions (Signed)

## 2017-01-12 NOTE — Op Note (Signed)
NAME:  Tony King                ACCOUNT NO.: [id number]      MEDICAL RECORD NO.: [id number]      FACILITY:  [facility]      PHYSICIAN:  Paralee Cancel D  DATE OF BIRTH:  August 31, 1945     DATE OF PROCEDURE:  01/12/2017                                 OPERATIVE REPORT         PREOPERATIVE DIAGNOSIS: Left  hip osteoarthritis.      POSTOPERATIVE DIAGNOSIS:  Left hip osteoarthritis.      PROCEDURE:  Left total hip replacement through an anterior approach   utilizing DePuy THR system, component size 29mm pinnacle cup, a size 36+4  neutral   Altrex liner, a size 7 Hi Tri Lock stem with a 36+8.5 delta ceramic   ball.      SURGEON:  Pietro Cassis. Alvan Dame, M.D.      ASSISTANT:  Danae Orleans, PA-C     ANESTHESIA:  General.      SPECIMENS:  None.      COMPLICATIONS:  None.      BLOOD LOSS:  500 cc     DRAINS:  None.      INDICATION OF THE PROCEDURE:  Tony King is a 71 y.o. male who had   presented to office for evaluation of left hip pain.  Radiographs revealed   progressive degenerative changes with bone-on-bone   articulation to the  hip joint.  The patient had painful limited range of   motion significantly affecting their overall quality of life.  The patient was failing to    respond to conservative measures, and at this point was ready   to proceed with more definitive measures.  The patient has noted progressive   degenerative changes in his hip, progressive problems and dysfunction   with regarding the hip prior to surgery.  Consent was obtained for   benefit of pain relief.  Specific risk of infection, DVT, component   failure, dislocation, need for revision surgery, as well discussion of   the anterior versus posterior approach were reviewed.  Consent was   obtained for benefit of anterior pain relief through an anterior   approach.      PROCEDURE IN DETAIL:  The patient was brought to operative theater.   Once adequate anesthesia,  preoperative antibiotics, 2 gm of Ancef, 1 gm of Tranexamic Acid, and 10 mg of Decadron administered.   The patient was positioned supine on the OSI [place] table.  Once adequate   padding of boney process was carried out, we had predraped out the hip, and  used fluoroscopy to confirm orientation of the pelvis and position.      The left hip was then prepped and draped from proximal iliac crest to   mid thigh with shower curtain technique.      Time-out was performed identifying the patient, planned procedure, and   extremity.     An incision was then made 2 cm distal and lateral to the   anterior superior iliac spine extending over the orientation of the   tensor fascia lata muscle and sharp dissection was carried down to the   fascia of the muscle and protractor placed in the soft tissues.      The  fascia was then incised.  The muscle belly was identified and swept   laterally and retractor placed along the superior neck.  Following   cauterization of the circumflex vessels and removing some pericapsular   fat, a second cobra retractor was placed on the inferior neck.  A third   retractor was placed on the anterior acetabulum after elevating the   anterior rectus.  A L-capsulotomy was along the line of the   superior neck to the trochanteric fossa, then extended proximally and   distally.  Tag sutures were placed and the retractors were then placed   intracapsular.  We then identified the trochanteric fossa and   orientation of my neck cut, confirmed this radiographically   and then made a neck osteotomy with the femur on traction.  The femoral   head was removed without difficulty or complication.  Traction was let   off and retractors were placed posterior and anterior around the   acetabulum.      The labrum and foveal tissue were debrided.  I began reaming with a 38mm   reamer and reamed up to 98mm reamer with good bony bed preparation and a 58mm   cup was chosen.  The final 56mm  Pinnacle cup was then impacted under fluoroscopy  to confirm the depth of penetration and orientation with respect to   abduction.  A screw was placed followed by the hole eliminator.  The final   36+4 neutral Altrex liner was impacted with good visualized rim fit.  The cup was positioned anatomically within the acetabular portion of the pelvis.      At this point, the femur was rolled at 80 degrees.  Further capsule was   released off the inferior aspect of the femoral neck.  I then   released the superior capsule proximally.  The hook was placed laterally   along the femur and elevated manually and held in position with the bed   hook.  The leg was then extended and adducted with the leg rolled to 100   degrees of external rotation.  Once the proximal femur was fully   exposed, I used a box osteotome to set orientation.  I then began   broaching with the starting chili pepper broach and passed this by hand and then broached up to 7.  With the 7 broach in place I chose a high offset neck and did several trial reductions.  The offset was appropriate, leg lengths   appeared to be equal best matched with the 8.5 head ball confirmed radiographically.   Given these findings, I went ahead and dislocated the hip, repositioned all   retractors and positioned the right hip in the extended and abducted position.  The final 7 Hi Tri Lock stem was   chosen and it was impacted down to the level of neck cut.  Based on this   and the trial reduction, a 36+8.5 delta ceramic ball was chosen and   impacted onto a clean and dry trunnion, and the hip was reduced.  The   hip had been irrigated throughout the case again at this point.  I did   reapproximate the superior capsular leaflet to the anterior leaflet   using #1 Vicryl.  The fascia of the   tensor fascia lata muscle was then reapproximated using #1 Vicryl and #0 Stratafix sutures.  The   remaining wound was closed with 2-0 Vicryl and running 4-0 Monocryl.    The hip was cleaned,  dried, and dressed sterilely using Dermabond and   Aquacel dressing.  He was then brought   to recovery room in stable condition tolerating the procedure well.    Danae Orleans, PA-C was present for the entirety of the case involved from   preoperative positioning, perioperative retractor management, general   facilitation of the case, as well as primary wound closure as assistant.            Pietro Cassis Alvan Dame, M.D.        01/12/2017 8:56 AM

## 2017-01-12 NOTE — Transfer of Care (Signed)
Immediate Anesthesia Transfer of Care Note  Patient: Tony King  Procedure(s) Performed: Procedure(s) with comments: LEFT TOTAL HIP ARTHROPLASTY ANTERIOR APPROACH (Left) - 70 mins  Patient Location: PACU  Anesthesia Type:General  Level of Consciousness: Patient easily awoken, sedated, comfortable, cooperative, following commands, responds to stimulation.   Airway & Oxygen Therapy: Patient spontaneously breathing, ventilating well, oxygen via simple oxygen mask.  Post-op Assessment: Report given to PACU RN, vital signs reviewed and stable, moving all extremities.   Post vital signs: Reviewed and stable.  Complications: No apparent anesthesia complications  Last Vitals:  Vitals:   01/12/17 0544 01/12/17 0932  BP: (!) 113/56 (!) 125/58  Pulse: 66 95  Resp: 18   Temp: 36.8 C   SpO2: 97% 94%    Last Pain:  Vitals:   01/12/17 0544  TempSrc: Oral      Patients Stated Pain Goal: 4 (33/00/76 2263)  Complications: No apparent anesthesia complications

## 2017-01-12 NOTE — Anesthesia Postprocedure Evaluation (Signed)
Anesthesia Post Note  Patient: Tony King  Procedure(s) Performed: LEFT TOTAL HIP ARTHROPLASTY ANTERIOR APPROACH (Left Hip)     Patient location during evaluation: PACU Anesthesia Type: General Level of consciousness: awake and alert Pain management: pain level controlled Vital Signs Assessment: post-procedure vital signs reviewed and stable Respiratory status: spontaneous breathing, nonlabored ventilation, respiratory function stable and patient connected to nasal cannula oxygen Cardiovascular status: blood pressure returned to baseline and stable Postop Assessment: no apparent nausea or vomiting Anesthetic complications: no    Last Vitals:  Vitals:   01/12/17 1211 01/12/17 1301  BP: (!) 130/57 (!) 116/50  Pulse: 90 86  Resp: 14 14  Temp: 36.5 C 36.9 C  SpO2: 100% 100%    Last Pain:  Vitals:   01/12/17 1301  TempSrc: Axillary  PainSc:                  Lyrical Sowle S

## 2017-01-12 NOTE — Anesthesia Procedure Notes (Signed)
Procedure Name: Intubation Date/Time: 01/12/2017 7:21 AM Performed by: Deliah Boston Pre-anesthesia Checklist: Patient identified, Emergency Drugs available, Suction available and Patient being monitored Patient Re-evaluated:Patient Re-evaluated prior to induction Oxygen Delivery Method: Circle system utilized Preoxygenation: Pre-oxygenation with 100% oxygen Induction Type: IV induction Ventilation: Mask ventilation without difficulty Laryngoscope Size: Mac and 4 Grade View: Grade III Tube type: Oral Tube size: 7.5 mm Number of attempts: 1 Airway Equipment and Method: Stylet and Oral airway Placement Confirmation: ETT inserted through vocal cords under direct vision,  positive ETCO2 and breath sounds checked- equal and bilateral Secured at: 23 cm Tube secured with: Tape Dental Injury: Teeth and Oropharynx as per pre-operative assessment

## 2017-01-12 NOTE — Interval H&P Note (Signed)
History and Physical Interval Note:  01/12/2017 6:53 AM  Tony King  has presented today for surgery, with the diagnosis of Left hip osteoarthritis  The various methods of treatment have been discussed with the patient and family. After consideration of risks, benefits and other options for treatment, the patient has consented to  Procedure(s) with comments: LEFT TOTAL HIP ARTHROPLASTY ANTERIOR APPROACH (Left) - 70 mins as a surgical intervention .  The patient's history has been reviewed, patient examined, no change in status, stable for surgery.  I have reviewed the patient's chart and labs.  Questions were answered to the patient's satisfaction.     Mauri Pole

## 2017-01-12 NOTE — Progress Notes (Signed)
Pt arrived to floor from PACU on stretcher, slid to bed w/ 3 assist. VSS. Pt drowsy, awakens on calling. Reports pain 3/10 and tolerable. Aquacel intact to left hip, ice in place. NV checks WNL. Pt oriented to callbell and environment. POC discussed.

## 2017-01-12 NOTE — Evaluation (Signed)
Physical Therapy Evaluation Patient Details Name: Tony King MRN: [id number] DOB: [birth year] Today's Date: 01/12/2017   History of Present Illness  71 yo male s/p L THA-direct anterior 01/12/17.   Clinical Impression  On eval, pt required Mod assist for mobility. He walked ~25 feet with a RW. Pain rated 9/10 with activity. Will follow and progress activity as tolerated. Per MD-no f/u PT is planned.     Follow Up Recommendations DC plan and follow up therapy as arranged by surgeon    Equipment Recommendations  None recommended by PT    Recommendations for Other Services       Precautions / Restrictions Precautions Precautions: Fall Restrictions Weight Bearing Restrictions: No LLE Weight Bearing: Weight bearing as tolerated      Mobility  Bed Mobility Overal bed mobility: Needs Assistance Bed Mobility: Supine to Sit     Supine to sit: Mod assist;HOB elevated     General bed mobility comments: Assist for trunk and R LE. Increased time. Effortful for pt. VCs cues , technique. Pt normally sleep in a recliner  Transfers Overall transfer level: Needs assistance Equipment used: Rolling walker (2 wheeled) Transfers: Sit to/from Stand Sit to Stand: From elevated surface;Mod assist         General transfer comment: Assist to rise, stabilize,control descent. VCs safety, technique, hand/LE placement  Ambulation/Gait Ambulation/Gait assistance: Min assist Ambulation Distance (Feet): 25 Feet Assistive device: Rolling walker (2 wheeled) Gait Pattern/deviations: Step-to pattern;Antalgic     General Gait Details: Assist to stabilize. VCS safety, sequence. slow gait speed.   Stairs            Wheelchair Mobility    Modified Rankin (Stroke Patients Only)       Balance Overall balance assessment: Needs assistance         Standing balance support: Bilateral upper extremity supported Standing balance-Leahy Scale: Poor Standing balance comment: requires  RW                             Pertinent Vitals/Pain Pain Assessment: 0-10 Pain Score: 9  Pain Location: L hip with activity Pain Descriptors / Indicators: Sore;Grimacing;Aching Pain Intervention(s): Limited activity within patient's tolerance;Repositioned    Home Living Family/patient expects to be discharged to:: Private residence Living Arrangements: Spouse/significant other Available Help at Discharge: Family Type of Home: House Home Access: Stairs to enter Entrance Stairs-Rails: Right Entrance Stairs-Number of Steps: 2 Home Layout: One level Home Equipment: Environmental consultant - 2 wheels;Cane - single point Additional Comments: pt sleeps in recliner    Prior Function Level of Independence: Independent               Hand Dominance        Extremity/Trunk Assessment   Upper Extremity Assessment Upper Extremity Assessment: Defer to OT evaluation    Lower Extremity Assessment Lower Extremity Assessment: Generalized weakness (s/p L THA)    Cervical / Trunk Assessment Cervical / Trunk Assessment: Normal  Communication   Communication: No difficulties  Cognition Arousal/Alertness: Awake/alert Behavior During Therapy: WFL for tasks assessed/performed Overall Cognitive Status: Within Functional Limits for tasks assessed                                        General Comments      Exercises     Assessment/Plan    PT Assessment Patient needs continued  PT services  PT Problem List Decreased strength;Decreased mobility;Decreased range of motion;Decreased activity tolerance;Decreased balance;Pain;Decreased knowledge of use of DME       PT Treatment Interventions DME instruction;Therapeutic activities;Gait training;Therapeutic exercise;Patient/family education;Functional mobility training;Balance training    PT Goals (Current goals can be found in the Care Plan section)  Acute Rehab PT Goals Patient Stated Goal: home. less pain. PT Goal  Formulation: With patient/family Time For Goal Achievement: 01/26/17 Potential to Achieve Goals: Good    Frequency 7X/week   Barriers to discharge        Co-evaluation               AM-PAC PT "6 Clicks" Daily Activity  Outcome Measure Difficulty turning over in bed (including adjusting bedclothes, sheets and blankets)?: Unable Difficulty moving from lying on back to sitting on the side of the bed? : Unable Difficulty sitting down on and standing up from a chair with arms (e.g., wheelchair, bedside commode, etc,.)?: Unable Help needed moving to and from a bed to chair (including a wheelchair)?: A Lot Help needed walking in hospital room?: A Lot Help needed climbing 3-5 steps with a railing? : A Lot 6 Click Score: 9    End of Session Equipment Utilized During Treatment: Gait belt Activity Tolerance: Patient limited by pain Patient left: in chair;with call bell/phone within reach;with family/visitor present   PT Visit Diagnosis: Muscle weakness (generalized) (M62.81);Difficulty in walking, not elsewhere classified (R26.2)    Time: 7062-3762 PT Time Calculation (min) (ACUTE ONLY): 21 min   Charges:   PT Evaluation $PT Eval Low Complexity: 1 Low     PT G Codes:          Weston Anna, MPT Pager: [phone]

## 2017-01-12 NOTE — Anesthesia Preprocedure Evaluation (Signed)
Anesthesia Evaluation  Patient identified by MRN, date of birth, ID band Patient awake    Reviewed: Allergy & Precautions, NPO status , Patient's Chart, lab work & pertinent test results  Airway Mallampati: II  TM Distance: >3 FB Neck ROM: Full    Dental no notable dental hx.    Pulmonary Current Smoker,    Pulmonary exam normal breath sounds clear to auscultation       Cardiovascular hypertension, + CAD and + CABG  Normal cardiovascular exam Rhythm:Regular Rate:Normal     Neuro/Psych negative neurological ROS  negative psych ROS   GI/Hepatic negative GI ROS, Neg liver ROS,   Endo/Other  diabetesMorbid obesity  Renal/GU negative Renal ROS  negative genitourinary   Musculoskeletal negative musculoskeletal ROS (+)   Abdominal   Peds negative pediatric ROS (+)  Hematology negative hematology ROS (+)   Anesthesia Other Findings   Reproductive/Obstetrics negative OB ROS                             Anesthesia Physical Anesthesia Plan  ASA: III  Anesthesia Plan: Spinal   Post-op Pain Management:    Induction: Intravenous  PONV Risk Score and Plan: 1 and Ondansetron and Dexamethasone  Airway Management Planned: Simple Face Mask  Additional Equipment:   Intra-op Plan:   Post-operative Plan:   Informed Consent: I have reviewed the patients History and Physical, chart, labs and discussed the procedure including the risks, benefits and alternatives for the proposed anesthesia with the patient or authorized representative who has indicated his/her understanding and acceptance.   Dental advisory given  Plan Discussed with: CRNA and Surgeon  Anesthesia Plan Comments:         Anesthesia Quick Evaluation

## 2017-01-13 DIAGNOSIS — E669 Obesity, unspecified: Secondary | ICD-10-CM | POA: Diagnosis present

## 2017-01-13 LAB — BASIC METABOLIC PANEL
ANION GAP: 8 (ref 5–15)
BUN: 24 mg/dL — ABNORMAL HIGH (ref 6–20)
CHLORIDE: 101 mmol/L (ref 101–111)
CO2: 26 mmol/L (ref 22–32)
Calcium: 8.2 mg/dL — ABNORMAL LOW (ref 8.9–10.3)
Creatinine, Ser: 0.98 mg/dL (ref 0.61–1.24)
GFR calc Af Amer: >60 mL/min (ref >60)
GLUCOSE: 135 mg/dL — AB (ref 65–99)
POTASSIUM: 4.7 mmol/L (ref 3.5–5.1)
SODIUM: 135 mmol/L (ref 135–145)

## 2017-01-13 LAB — CBC
HCT: 38.1 % — ABNORMAL LOW (ref 39.0–52.0)
HEMOGLOBIN: 12.8 g/dL — AB (ref 13.0–17.0)
MCH: 31.6 pg (ref 26.0–34.0)
MCHC: 33.6 g/dL (ref 30.0–36.0)
MCV: 94.1 fL (ref 78.0–100.0)
PLATELETS: 179 10*3/uL (ref 150–400)
RBC: 4.05 MIL/uL — AB (ref 4.22–5.81)
RDW: 14 % (ref 11.5–15.5)
WBC: 11.2 10*3/uL — AB (ref 4.0–10.5)

## 2017-01-13 LAB — GLUCOSE, CAPILLARY
GLUCOSE-CAPILLARY: 147 mg/dL — AB (ref 65–99)
GLUCOSE-CAPILLARY: 180 mg/dL — AB (ref 65–99)

## 2017-01-13 NOTE — Evaluation (Signed)
Occupational Therapy Evaluation Patient Details Name: Tony King MRN: [id number] DOB: 15-Jul-1945 Today's Date: 01/13/2017    History of Present Illness 71 yo male s/p L THA-direct anterior 01/12/17.    Clinical Impression   Pt was admitted for the above sx. All education was completed. No further OT is needed at this time    Follow Up Recommendations  Supervision/Assistance - 24 hour    Equipment Recommendations  None recommended by OT    Recommendations for Other Services       Precautions / Restrictions Precautions Precautions: Fall Restrictions Weight Bearing Restrictions: No LLE Weight Bearing: Weight bearing as tolerated      Mobility Bed Mobility         Supine to sit: Min guard     General bed mobility comments: extra time  Transfers   Equipment used: Rolling walker (2 wheeled)   Sit to Stand: Supervision;From elevated surface              Balance                                           ADL either performed or assessed with clinical judgement   ADL Overall ADL's : Needs assistance/impaired Eating/Feeding: Independent   Grooming: Supervision/safety;Standing   Upper Body Bathing: Set up;Sitting   Lower Body Bathing: Moderate assistance;Sit to/from stand   Upper Body Dressing : Set up;Sitting   Lower Body Dressing: Maximal assistance;Sit to/from stand   Toilet Transfer: Supervision/safety;Ambulation;RW (back to chair; pt used BSC earlier)   Toileting- Water quality scientist and Hygiene: Supervision/safety;Sit to/from stand   Tub/ Shower Transfer: Walk-in shower;Min guard;Ambulation;Rolling walker (for safety)     General ADL Comments: wife will assist with adls as needed.  Educated to wipe legs of 3:1 off after using shower.     Vision         Perception     Praxis      Pertinent Vitals/Pain Pain Assessment: 0-10 Pain Score: 3  Pain Location: L hip with activity Pain Descriptors / Indicators:  Sore Pain Intervention(s): Limited activity within patient's tolerance;Monitored during session;Repositioned;Ice applied     Hand Dominance     Extremity/Trunk Assessment Upper Extremity Assessment Upper Extremity Assessment: Overall WFL for tasks assessed           Communication Communication Communication: No difficulties   Cognition Arousal/Alertness: Awake/alert Behavior During Therapy: WFL for tasks assessed/performed Overall Cognitive Status: Within Functional Limits for tasks assessed                                     General Comments       Exercises     Shoulder Instructions      Home Living Family/patient expects to be discharged to:: Private residence Living Arrangements: Spouse/significant other Available Help at Discharge: Family               Bathroom Shower/Tub: Walk-in Corporate treasurer Toilet: Handicapped height     Home Equipment: Bedside commode   Additional Comments: pt sleeps in recliner      Prior Functioning/Environment Level of Independence: Independent                 OT Problem List:        OT Treatment/Interventions:  OT Goals(Current goals can be found in the care plan section) Acute Rehab OT Goals Patient Stated Goal: home. less pain. OT Goal Formulation: All assessment and education complete, DC therapy  OT Frequency:     Barriers to D/C:            Co-evaluation              AM-PAC PT "6 Clicks" Daily Activity     Outcome Measure Help from another person eating meals?: None Help from another person taking care of personal grooming?: A Little Help from another person toileting, which includes using toliet, bedpan, or urinal?: A Little Help from another person bathing (including washing, rinsing, drying)?: A Lot Help from another person to put on and taking off regular upper body clothing?: A Little Help from another person to put on and taking off regular lower body clothing?: A  Little 6 Click Score: 18   End of Session    Activity Tolerance: Patient tolerated treatment well Patient left: in chair;with call bell/phone within reach;with family/visitor present  OT Visit Diagnosis: Pain Pain - Right/Left: Left Pain - part of body: Hip                Time: 3009-2330 OT Time Calculation (min): 20 min Charges:  OT General Charges $OT Visit: 1 Visit OT Evaluation $OT Eval Low Complexity: 1 Low G-Codes:     [place], OTR/L [phone] 01/13/2017  [name] 01/13/2017, 9:35 AM

## 2017-01-13 NOTE — Progress Notes (Signed)
Patient discharged to home w/ family. Given all belongings, instructions, prescriptions. Wife present for all teaching. Escorted to pov via w/c.

## 2017-01-13 NOTE — Progress Notes (Signed)
     Subjective: 1 Day Post-Op Procedure(s) (LRB): LEFT TOTAL HIP ARTHROPLASTY ANTERIOR APPROACH (Left)   Seen by Dr. Alvan Dame. Patient reports pain as mild, pain controlled. No events throughout the night. Ready to be discharged home.   Objective:   VITALS:   Vitals:   01/13/17 0116 01/13/17 0450  BP: (!) 118/45 (!) 120/48  Pulse: 61 (!) 56  Resp: 17 16  Temp: 98.3 F (36.8 C) 98.5 F (36.9 C)  SpO2: 96% 98%    Dorsiflexion/Plantar flexion intact Incision: dressing C/D/I No cellulitis present Compartment soft  LABS  Recent Labs  01/13/17 0540  HGB 12.8*  HCT 38.1*  WBC 11.2*  PLT 179     Recent Labs  01/13/17 0540  NA 135  K 4.7  BUN 24*  CREATININE 0.98  GLUCOSE 135*     Assessment/Plan: 1 Day Post-Op Procedure(s) (LRB): LEFT TOTAL HIP ARTHROPLASTY ANTERIOR APPROACH (Left) Foley cath d/c'ed Advance diet Up with therapy D/C IV fluids Discharge home Follow up in 2 weeks at [facility]. Follow up with OLIN,Kayda Allers D in 2 weeks.  Contact information:  [facility] [address] [place] [phone]    Obese (BMI 30-39.9) Estimated body mass index is 36.95 kg/m as calculated from the following:   Height as of this encounter: 5\' 8"  (1.727 m).   Weight as of this encounter: 110.2 kg (243 lb). Patient also counseled that weight may inhibit the healing process Patient counseled that losing weight will help with future health issues        Tony King. Tony King   PAC  01/13/2017, 8:25 AM

## 2017-01-13 NOTE — Progress Notes (Signed)
Discharge planning, no HH needs identified. Has DME. [phone]

## 2017-01-13 NOTE — Progress Notes (Signed)
Physical Therapy Treatment Patient Details Name: JAQUIS PICKLESIMER MRN: [id number] DOB: Apr 27, 1945 Today's Date: 01/13/2017    History of Present Illness 71 yo male s/p L THA-direct anterior 01/12/17.     PT Comments    Progressing well with mobility. Reviewed exercises, gait training, and stair negotiation training. Issued HEP for pt to perform 2x/day.  Verbally reviewed car transfer. All education completed. Ready to d/c from PT standpoint-made RN aware.    Follow Up Recommendations  DC plan and follow up therapy as arranged by surgeon     Equipment Recommendations  None recommended by PT    Recommendations for Other Services       Precautions / Restrictions Precautions Precautions: Fall Restrictions Weight Bearing Restrictions: No LLE Weight Bearing: Weight bearing as tolerated    Mobility  Bed Mobility         Supine to sit: Min guard     General bed mobility comments: oob in recliner  Transfers Overall transfer level: Needs assistance Equipment used: Rolling walker (2 wheeled) Transfers: Sit to/from Stand Sit to Stand: Supervision         General transfer comment: for safety. VCs hand placement  Ambulation/Gait Ambulation/Gait assistance: Min guard Ambulation Distance (Feet): 100 Feet Assistive device: Rolling walker (2 wheeled) Gait Pattern/deviations: Step-to pattern;Step-through pattern;Decreased stride length     General Gait Details: close guard for safety.    Stairs Stairs: Yes Min guard assist Stair Management: One rail Left;Sideways Number of Stairs: 2 General stair comments: VCS safety, sequence, technique. Close guard for safety.   Wheelchair Mobility    Modified Rankin (Stroke Patients Only)       Balance                                            Cognition Arousal/Alertness: Awake/alert Behavior During Therapy: WFL for tasks assessed/performed Overall Cognitive Status: Within Functional Limits for  tasks assessed                                        Exercises Total Joint Exercises Ankle Circles/Pumps: AROM;Both;10 reps;Supine Quad Sets: AROM;Both;10 reps;Supine Heel Slides: AAROM;Left;10 reps;Supine Hip ABduction/ADduction: AAROM;Left;10 reps;Supine Knee Flexion: AROM;Left;Standing Marching in Standing: AROM;Both;10 reps;Standing General Exercises - Lower Extremity Heel Raises: AROM;Both;Standing;10 reps    General Comments        Pertinent Vitals/Pain Pain Assessment: 0-10 Pain Score: 4  Pain Location: L hip with activity Pain Descriptors / Indicators: Sore Pain Intervention(s): Limited activity within patient's tolerance;Repositioned;Ice applied    Home Living Family/patient expects to be discharged to:: Private residence Living Arrangements: Spouse/significant other Available Help at Discharge: Family         Home Equipment: Bedside commode Additional Comments: pt sleeps in recliner    Prior Function Level of Independence: Independent          PT Goals (current goals can now be found in the care plan section) Acute Rehab PT Goals Patient Stated Goal: home. less pain. Progress towards PT goals: Progressing toward goals    Frequency    7X/week      PT Plan Current plan remains appropriate    Co-evaluation              AM-PAC PT "6 Clicks" Daily Activity  Outcome Measure  Difficulty turning  over in bed (including adjusting bedclothes, sheets and blankets)?: A Little Difficulty moving from lying on back to sitting on the side of the bed? : A Little Difficulty sitting down on and standing up from a chair with arms (e.g., wheelchair, bedside commode, etc,.)?: A Little Help needed moving to and from a bed to chair (including a wheelchair)?: A Little Help needed walking in hospital room?: A Little Help needed climbing 3-5 steps with a railing? : A Little 6 Click Score: 18    End of Session Equipment Utilized During  Treatment: Gait belt Activity Tolerance: Patient tolerated treatment well Patient left: in chair;with call bell/phone within reach;with family/visitor present   PT Visit Diagnosis: Muscle weakness (generalized) (M62.81);Difficulty in walking, not elsewhere classified (R26.2)     Time: 4166-0630 PT Time Calculation (min) (ACUTE ONLY): 22 min  Charges:  $Gait Training: 8-22 mins                    G Codes:          Weston Anna, MPT Pager: [phone]

## 2017-01-18 NOTE — Discharge Summary (Signed)
Physician Discharge Summary  Patient ID: HUBER MATHERS MRN: [id number] DOB/AGE: 1945/12/03 71 y.o.  Admit date: 01/12/2017 Discharge date: 01/13/2017   Procedures:  Procedure(s) (LRB): LEFT TOTAL HIP ARTHROPLASTY ANTERIOR APPROACH (Left)  Attending Physician:  Dr. Paralee Cancel   Admission Diagnoses:   Left hip primary OA / pain  Discharge Diagnoses:  Principal Problem:   S/P left THA, AA Active Problems:   Obese  Past Medical History:  Diagnosis Date  . Anginal pain (HCC)    hx of before CABG  . Arthritis   . Atherosclerosis of both lower extremities with intermittent claudication ([place])   . Complication of anesthesia    "insides did not start working after surgery" ; "they had to pump my stomach with a tube down my throat"   . Coronary artery disease   . Diabetes mellitus without complication ([place])    type 2  . Dilated cardiomyopathy ([place])   . GERD (gastroesophageal reflux disease)   . H/O bronchitis   . H/O pleurisy 1968  . Headache    occasional  . Hypercholesteremia   . Hypertension    under control  . Pneumonia    hx of  . Shortness of breath   . Tobacco abuse     HPI:    Tony King, 71 y.o. male, has a history of pain and functional disability in the left hip(s) due to arthritis and patient has failed non-surgical conservative treatments for greater than 12 weeks to include NSAID's and/or analgesics, corticosteriod injections and activity modification.  Onset of symptoms was gradual starting 3+ years ago with gradually worsening course since that time.The patient noted no past surgery on the right hip(s).  Patient currently rates pain in the right hip at 7 out of 10 with activity. Patient has night pain, worsening of pain with activity and weight bearing, trendelenberg gait, pain that interfers with activities of daily living and pain with passive range of motion. Patient has evidence of periarticular osteophytes and joint space narrowing by imaging  studies. This condition presents safety issues increasing the risk of falls.  There is no current active infection.   Risks, benefits and expectations were discussed with the patient.  Risks including but not limited to the risk of anesthesia, blood clots, nerve damage, blood vessel damage, failure of the prosthesis, infection and up to and including death.  Patient understand the risks, benefits and expectations and wishes to proceed with surgery.   PCP: Algis Greenhouse, MD   Discharged Condition: good  Hospital Course:  Patient underwent the above stated procedure on 01/12/2017. Patient tolerated the procedure well and brought to the recovery room in good condition and subsequently to the floor.  POD #1 BP: 120/48 ; Pulse: 56 ; Temp: 98.5 F (36.9 C) ; Resp: 16 Patient reports pain as mild, pain controlled. No events throughout the night. Ready to be discharged home. Dorsiflexion/plantar flexion intact, incision: dressing C/D/I, no cellulitis present and compartment soft.   LABS  Basename    HGB     12.8  HCT     38.1    Discharge Exam: General appearance: alert, cooperative and no distress Extremities: Homans sign is negative, no sign of DVT, no edema, redness or tenderness in the calves or thighs and no ulcers, gangrene or trophic changes  Disposition: Home with follow up in 2 weeks   Follow-up Information    Paralee Cancel, MD. Schedule an appointment as soon as possible for a visit in 2  week(s).   Specialty:  Orthopedic Surgery Contact information: [address] [place] [address] [phone]           Discharge Instructions    Call MD / Call 911    Complete by:  As directed    If you experience chest pain or shortness of breath, CALL 911 and be transported to the hospital emergency room.  If you develope a fever above 101 F, pus (white drainage) or increased drainage or redness at the wound, or calf pain, call your surgeon's office.   Change  dressing    Complete by:  As directed    Maintain surgical dressing until follow up in the clinic. If the edges start to pull up, may reinforce with tape. If the dressing is no longer working, may remove and cover with gauze and tape, but must keep the area dry and clean.  Call with any questions or concerns.   Constipation Prevention    Complete by:  As directed    Drink plenty of fluids.  Prune juice may be helpful.  You may use a stool softener, such as Colace (over the counter) 100 mg twice a day.  Use MiraLax (over the counter) for constipation as needed.   Diet - low sodium heart healthy    Complete by:  As directed    Discharge instructions    Complete by:  As directed    Maintain surgical dressing until follow up in the clinic. If the edges start to pull up, may reinforce with tape. If the dressing is no longer working, may remove and cover with gauze and tape, but must keep the area dry and clean.  Follow up in 2 weeks at [facility]. Call with any questions or concerns.   Increase activity slowly as tolerated    Complete by:  As directed    Weight bearing as tolerated with assist device (walker, cane, etc) as directed, use it as long as suggested by your surgeon or therapist, typically at least 4-6 weeks.   TED hose    Complete by:  As directed    Use stockings (TED hose) for 2 weeks on both leg(s).  You may remove them at night for sleeping.      Allergies as of 01/13/2017      Reactions   Niacin Other (See Comments)   Unknown; "i think it made me sick"    Oxycodone Other (See Comments)   Hallucinations      Medication List    STOP taking these medications   EXCEDRIN EXTRA STRENGTH 250-250-65 MG tablet Generic drug:  aspirin-acetaminophen-caffeine     TAKE these medications   aspirin EC 81 MG tablet Take 81 mg by mouth daily.   atorvastatin 40 MG tablet Commonly known as:  LIPITOR Take 40 mg by mouth daily.   clopidogrel 75 MG tablet Commonly known  as:  PLAVIX Take 75 mg by mouth daily.   docusate sodium 100 MG capsule Commonly known as:  COLACE Take 1 capsule (100 mg total) by mouth 2 (two) times daily.   ferrous sulfate 325 (65 FE) MG tablet Commonly known as:  FERROUSUL Take 1 tablet (325 mg total) by mouth 3 (three) times daily with meals.   Fish Oil 1000 MG Caps Take 2,000 mg by mouth 4 (four) times a week.   furosemide 40 MG tablet Commonly known as:  LASIX Take 1.5 tablets in the am (60 mg) and take 1 tablet in the pm (  40 mg ). What changed:  how much to take  how to take this  when to take this  additional instructions   HYDROcodone-acetaminophen 7.5-325 MG tablet Commonly known as:  NORCO Take 1-2 tablets by mouth every 4 (four) hours as needed for moderate pain or severe pain.   JARDIANCE 25 MG Tabs tablet Generic drug:  empagliflozin Take 25 mg by mouth daily.   lisinopril 40 MG tablet Commonly known as:  PRINIVIL,ZESTRIL Take 1 tablet (40 mg total) by mouth every morning.   metFORMIN 1000 MG tablet Commonly known as:  GLUCOPHAGE Take 1,000 mg by mouth at bedtime.   methocarbamol 500 MG tablet Commonly known as:  ROBAXIN Take 1 tablet (500 mg total) by mouth every 6 (six) hours as needed for muscle spasms.   metoprolol tartrate 50 MG tablet Commonly known as:  LOPRESSOR Take 1 tablet (50 mg total) by mouth 2 (two) times daily.   polyethylene glycol packet Commonly known as:  MIRALAX / GLYCOLAX Take 17 g by mouth 2 (two) times daily.   spironolactone 25 MG tablet Commonly known as:  ALDACTONE Take 1 tablet (25 mg total) by mouth daily.            Discharge Care Instructions        Start     Ordered   01/13/17 0000  Change dressing    Comments:  Maintain surgical dressing until follow up in the clinic. If the edges start to pull up, may reinforce with tape. If the dressing is no longer working, may remove and cover with gauze and tape, but must keep the area dry and clean.  Call  with any questions or concerns.   01/13/17 1941       Signed: [place]. Ashanti Ratti   PA-C  01/18/2017, 2:59 PM

## 2017-02-17 DIAGNOSIS — E782 Mixed hyperlipidemia: Secondary | ICD-10-CM | POA: Diagnosis not present

## 2017-02-17 DIAGNOSIS — R739 Hyperglycemia, unspecified: Secondary | ICD-10-CM | POA: Diagnosis not present

## 2017-02-17 DIAGNOSIS — I10 Essential (primary) hypertension: Secondary | ICD-10-CM | POA: Diagnosis not present

## 2017-02-17 DIAGNOSIS — E119 Type 2 diabetes mellitus without complications: Secondary | ICD-10-CM | POA: Diagnosis not present

## 2017-02-25 ENCOUNTER — Other Ambulatory Visit: Payer: Self-pay

## 2017-02-25 MED ORDER — CLOPIDOGREL BISULFATE 75 MG PO TABS: 75.0000 mg | ORAL_TABLET | Freq: Every day | ORAL | 3 refills | Status: DC

## 2017-03-03 DIAGNOSIS — Z471 Aftercare following joint replacement surgery: Secondary | ICD-10-CM | POA: Diagnosis not present

## 2017-03-03 DIAGNOSIS — Z96642 Presence of left artificial hip joint: Secondary | ICD-10-CM | POA: Diagnosis not present

## 2017-03-31 ENCOUNTER — Ambulatory Visit: Payer: PPO | Admitting: Cardiology

## 2017-04-01 ENCOUNTER — Ambulatory Visit: Payer: PPO | Admitting: Cardiology

## 2017-04-01 ENCOUNTER — Encounter: Payer: Self-pay | Admitting: Cardiology

## 2017-04-01 VITALS — BP 120/60 | HR 69 | Ht 68.0 in | Wt 246.1 lb

## 2017-04-01 DIAGNOSIS — E1151 Type 2 diabetes mellitus with diabetic peripheral angiopathy without gangrene: Secondary | ICD-10-CM | POA: Diagnosis not present

## 2017-04-01 DIAGNOSIS — I420 Dilated cardiomyopathy: Secondary | ICD-10-CM

## 2017-04-01 DIAGNOSIS — I10 Essential (primary) hypertension: Secondary | ICD-10-CM | POA: Diagnosis not present

## 2017-04-01 DIAGNOSIS — Z720 Tobacco use: Secondary | ICD-10-CM

## 2017-04-01 DIAGNOSIS — I70213 Atherosclerosis of native arteries of extremities with intermittent claudication, bilateral legs: Secondary | ICD-10-CM

## 2017-04-01 DIAGNOSIS — E785 Hyperlipidemia, unspecified: Secondary | ICD-10-CM

## 2017-04-01 NOTE — Progress Notes (Signed)
Cardiology Office Note:    Date:  04/01/2017   ID:  Tony King, DOB November 17, 1945, MRN [id number]  PCP:  Algis Greenhouse, MD  Cardiologist:  Jenne Campus, MD    Referring MD: Algis Greenhouse, MD   Chief Complaint  Patient presents with  . 3 month follow up  Doing well  History of Present Illness:    Tony King is a 72 y.o. male with multiple medical problems.  He recently ended up having a left hip replacement surgery.  He is very happy and satisfied with the results.  Does not have any pain.  Started walking more and doing quite well with that.  He does have some tooth problem and some extraction is contemplated.  Overall cardiac wise doing well denies have any chest pain tightness squeezing pressure burning chest.  Past Medical History:  Diagnosis Date  . Anginal pain (HCC)    hx of before CABG  . Arthritis   . Atherosclerosis of both lower extremities with intermittent claudication ([place])   . Complication of anesthesia    "insides did not start working after surgery" ; "they had to pump my stomach with a tube down my throat"   . Coronary artery disease   . Diabetes mellitus without complication ([place])    type 2  . Dilated cardiomyopathy ([place])   . GERD (gastroesophageal reflux disease)   . H/O bronchitis   . H/O pleurisy 1968  . Headache    occasional  . Hypercholesteremia   . Hypertension    under control  . Pneumonia    hx of  . Shortness of breath   . Tobacco abuse     Past Surgical History:  Procedure Laterality Date  . APPENDECTOMY  ~age 78  . CARDIAC CATHETERIZATION     x3  . CHOLECYSTECTOMY  ~2013  . CORONARY ARTERY BYPASS GRAFT  2003   triple  . EXPLORATORY LAPAROTOMY  ~age 78   with appendectomy  . HERNIA REPAIR     with cholecystectomy  . JOINT REPLACEMENT Bilateral    knees  . TOTAL HIP ARTHROPLASTY Left 01/12/2017   Procedure: LEFT TOTAL HIP ARTHROPLASTY ANTERIOR APPROACH;  Surgeon: Paralee Cancel, MD;  Location: [facility];  Service:  Orthopedics;  Laterality: Left;  70 mins  . TOTAL KNEE ARTHROPLASTY      Current Medications: Current Meds  Medication Sig  . aspirin EC 81 MG tablet Take 81 mg by mouth daily.  Marland Kitchen atorvastatin (LIPITOR) 40 MG tablet Take 40 mg by mouth daily.   . clopidogrel (PLAVIX) 75 MG tablet Take 1 tablet (75 mg total) by mouth daily.  Marland Kitchen docusate sodium (COLACE) 100 MG capsule Take 1 capsule (100 mg total) by mouth 2 (two) times daily.  . furosemide (LASIX) 40 MG tablet Take 1.5 tablets in the am (60 mg) and take 1 tablet in the pm ( 40 mg ). (Patient taking differently: Take 40-80 mg by mouth See admin instructions. Take 80 mg in the morning and take 40 mg in the evening)  . JARDIANCE 25 MG TABS tablet Take 25 mg by mouth daily.   Marland Kitchen lisinopril (PRINIVIL,ZESTRIL) 40 MG tablet Take 1 tablet (40 mg total) by mouth every morning.  . metFORMIN (GLUCOPHAGE) 1000 MG tablet Take 1,000 mg by mouth at bedtime.  . metoprolol tartrate (LOPRESSOR) 50 MG tablet Take 1 tablet (50 mg total) by mouth 2 (two) times daily.  . Omega-3 Fatty Acids (FISH OIL) 1000 MG CAPS Take 2,000  mg by mouth 4 (four) times a week.   . polyethylene glycol (MIRALAX / GLYCOLAX) packet Take 17 g by mouth 2 (two) times daily.  Marland Kitchen spironolactone (ALDACTONE) 25 MG tablet Take 1 tablet (25 mg total) by mouth daily.     Allergies:   Niacin and Oxycodone   Social History   Socioeconomic History  . Marital status: Married    Spouse name: None  . Number of children: None  . Years of education: None  . Highest education level: None  Social Needs  . Financial resource strain: None  . Food insecurity - worry: None  . Food insecurity - inability: None  . Transportation needs - medical: None  . Transportation needs - non-medical: None  Occupational History  . None  Tobacco Use  . Smoking status: Current Every Day Smoker    Packs/day: 0.50    Years: 58.00    Pack years: 29.00    Types: Cigarettes  . Smokeless tobacco: Never Used    Substance and Sexual Activity  . Alcohol use: Yes    Comment: occasional  . Drug use: No  . Sexual activity: None  Other Topics Concern  . None  Social History Narrative  . None     Family History: The patient's family history includes Cancer in his father; Diabetes in his mother; Heart disease in his maternal grandfather; Hypertension in his mother and sister. ROS:   Please see the history of present illness.    All 14 point review of systems negative except as described per history of present illness  EKGs/Labs/Other Studies Reviewed:      Recent Labs: 12/24/2016: NT-Pro BNP 294 01/13/2017: BUN 24; Creatinine, Ser 0.98; Hemoglobin 12.8; Platelets 179; Potassium 4.7; Sodium 135  Recent Lipid Panel No results found for: CHOL, TRIG, HDL, CHOLHDL, VLDL, LDLCALC, LDLDIRECT  Physical Exam:    VS:  BP 120/60   Pulse 69   Ht 5\' 8"  (1.727 m)   Wt 246 lb 1.9 oz (111.6 kg)   SpO2 96%   BMI 37.42 kg/m     Wt Readings from Last 3 Encounters:  04/01/17 246 lb 1.9 oz (111.6 kg)  01/12/17 243 lb (110.2 kg)  01/05/17 243 lb 3.2 oz (110.3 kg)     GEN:  Well nourished, well developed in no acute distress HEENT: Normal NECK: No JVD; No carotid bruits LYMPHATICS: No lymphadenopathy CARDIAC: RRR, no murmurs, no rubs, no gallops RESPIRATORY:  Clear to auscultation without rales, wheezing or rhonchi  ABDOMEN: Soft, non-tender, non-distended MUSCULOSKELETAL:  No edema; No deformity  SKIN: Warm and dry LOWER EXTREMITIES: no swelling NEUROLOGIC:  Alert and oriented x 3 PSYCHIATRIC:  Normal affect   ASSESSMENT:    1. Atherosclerosis of native artery of both lower extremities with intermittent claudication ([place])   2. Essential hypertension   3. Type 2 diabetes mellitus with diabetic peripheral angiopathy without gangrene, without long-term current use of insulin ([place])   4. Dilated cardiomyopathy ([place])   5. Dyslipidemia   6. Tobacco abuse    PLAN:    In order of problems  listed above:  1. Atherosclerosis of lower extremities: Asymptomatic.  Start increasing distance for exercise which I encouraged him to do. 2. Essential hypertension: Blood pressure well controlled continue present medications. 3. Type 2 diabetes: Followed by internal medicine team. 4. Dilated cardiomyopathy on ACE inhibitor as well as beta-blocker which I will continue.  He is asymptomatic and hemodynamically stable. 5. Dyslipidemia on statin which I will continue. 6.  Smoking: Obviously we had discussion about that he is ready to quit he said simply too expensive to smoke   Medication Adjustments/Labs and Tests Ordered: Current medicines are reviewed at length with the patient today.  Concerns regarding medicines are outlined above.  No orders of the defined types were placed in this encounter.  Medication changes: No orders of the defined types were placed in this encounter.   Signed, Park Liter, MD, [facility] 04/01/2017 2:12 PM    [facility]

## 2017-04-01 NOTE — Patient Instructions (Signed)
Medication Instructions:  Your physician recommends that you continue on your current medications as directed. Please refer to the Current Medication list given to you today.  Stop your Plavix 5 days prior to your procedure with Dr. Maurine Cane.  Labwork: None ordered  Testing/Procedures: None ordered  Follow-Up: Your physician recommends that you schedule a follow-up appointment in: 3 months with Dr. Agustin Cree  Any Other Special Instructions Will Be Listed Below (If Applicable).     If you need a refill on your cardiac medications before your next appointment, please call your pharmacy.

## 2017-06-24 DIAGNOSIS — I10 Essential (primary) hypertension: Secondary | ICD-10-CM | POA: Diagnosis not present

## 2017-06-24 DIAGNOSIS — E119 Type 2 diabetes mellitus without complications: Secondary | ICD-10-CM | POA: Diagnosis not present

## 2017-06-24 DIAGNOSIS — E782 Mixed hyperlipidemia: Secondary | ICD-10-CM | POA: Diagnosis not present

## 2017-07-26 DIAGNOSIS — Z0000 Encounter for general adult medical examination without abnormal findings: Secondary | ICD-10-CM | POA: Diagnosis not present

## 2017-08-30 DIAGNOSIS — E782 Mixed hyperlipidemia: Secondary | ICD-10-CM | POA: Diagnosis not present

## 2017-09-30 DIAGNOSIS — E119 Type 2 diabetes mellitus without complications: Secondary | ICD-10-CM | POA: Diagnosis not present

## 2017-11-01 ENCOUNTER — Other Ambulatory Visit: Payer: Self-pay | Admitting: *Deleted

## 2017-11-01 MED ORDER — LISINOPRIL 40 MG PO TABS: 40.0000 mg | ORAL_TABLET | Freq: Every morning | ORAL | 0 refills | Status: DC

## 2017-11-01 NOTE — Telephone Encounter (Signed)
Refill for lisinopril sent to [facility] in [place].

## 2017-11-11 ENCOUNTER — Encounter: Payer: Self-pay | Admitting: Cardiology

## 2017-11-11 ENCOUNTER — Ambulatory Visit (INDEPENDENT_AMBULATORY_CARE_PROVIDER_SITE_OTHER): Payer: PPO | Admitting: Cardiology

## 2017-11-11 VITALS — BP 124/68 | HR 74 | Ht 68.0 in | Wt 243.0 lb

## 2017-11-11 DIAGNOSIS — E785 Hyperlipidemia, unspecified: Secondary | ICD-10-CM | POA: Diagnosis not present

## 2017-11-11 DIAGNOSIS — E119 Type 2 diabetes mellitus without complications: Secondary | ICD-10-CM | POA: Diagnosis not present

## 2017-11-11 DIAGNOSIS — H2513 Age-related nuclear cataract, bilateral: Secondary | ICD-10-CM | POA: Diagnosis not present

## 2017-11-11 DIAGNOSIS — I420 Dilated cardiomyopathy: Secondary | ICD-10-CM | POA: Diagnosis not present

## 2017-11-11 DIAGNOSIS — I2510 Atherosclerotic heart disease of native coronary artery without angina pectoris: Secondary | ICD-10-CM | POA: Diagnosis not present

## 2017-11-11 DIAGNOSIS — H52223 Regular astigmatism, bilateral: Secondary | ICD-10-CM | POA: Diagnosis not present

## 2017-11-11 MED ORDER — LOSARTAN POTASSIUM 50 MG PO TABS: 50.0000 mg | ORAL_TABLET | Freq: Every day | ORAL | 3 refills | Status: DC

## 2017-11-11 MED ORDER — RIVAROXABAN 2.5 MG PO TABS: 2.5000 mg | ORAL_TABLET | Freq: Two times a day (BID) | ORAL | 3 refills | Status: DC

## 2017-11-11 NOTE — Progress Notes (Signed)
Cardiology Office Note:    Date:  11/11/2017   ID:  Tony King, DOB November 14, 1945, MRN [id number]  PCP:  Algis Greenhouse, MD  Cardiologist:  Jenne Campus, MD    Referring MD: Algis Greenhouse, MD   No chief complaint on file. Doing well  History of Present Illness:    Tony King is a 72 y.o. male with coronary artery disease cardiomyopathy essential hypertension diabetes sadly she still continue to smoke.  Overall seems to be doing well no chest pain tightness squeezing pressure burning chest.  Past Medical History:  Diagnosis Date  . Anginal pain (HCC)    hx of before CABG  . Arthritis   . Atherosclerosis of both lower extremities with intermittent claudication ([place])   . Complication of anesthesia    "insides did not start working after surgery" ; "they had to pump my stomach with a tube down my throat"   . Coronary artery disease   . Diabetes mellitus without complication ([place])    type 2  . Dilated cardiomyopathy ([place])   . GERD (gastroesophageal reflux disease)   . H/O bronchitis   . H/O pleurisy 1968  . Headache    occasional  . Hypercholesteremia   . Hypertension    under control  . Pneumonia    hx of  . Shortness of breath   . Tobacco abuse     Past Surgical History:  Procedure Laterality Date  . APPENDECTOMY  ~age 21  . CARDIAC CATHETERIZATION     x3  . CHOLECYSTECTOMY  ~2013  . CORONARY ARTERY BYPASS GRAFT  2003   triple  . EXPLORATORY LAPAROTOMY  ~age 21   with appendectomy  . HERNIA REPAIR     with cholecystectomy  . JOINT REPLACEMENT Bilateral    knees  . TOTAL HIP ARTHROPLASTY Left 01/12/2017   Procedure: LEFT TOTAL HIP ARTHROPLASTY ANTERIOR APPROACH;  Surgeon: Paralee Cancel, MD;  Location: [facility];  Service: Orthopedics;  Laterality: Left;  70 mins  . TOTAL KNEE ARTHROPLASTY      Current Medications: Current Meds  Medication Sig  . aspirin EC 81 MG tablet Take 81 mg by mouth daily.  Marland Kitchen docusate sodium (COLACE) 100 MG capsule Take  1 capsule (100 mg total) by mouth 2 (two) times daily.  . furosemide (LASIX) 40 MG tablet Take 1.5 tablets in the am (60 mg) and take 1 tablet in the pm ( 40 mg ). (Patient taking differently: Take 40-80 mg by mouth See admin instructions. Take 80 mg in the morning and take 40 mg in the evening)  . JARDIANCE 25 MG TABS tablet Take 25 mg by mouth daily.   . metFORMIN (GLUCOPHAGE) 1000 MG tablet Take 1,000 mg by mouth at bedtime.  . metoprolol tartrate (LOPRESSOR) 50 MG tablet Take 1 tablet (50 mg total) by mouth 2 (two) times daily.  . Omega-3 Fatty Acids (FISH OIL) 1000 MG CAPS Take 2,000 mg by mouth 4 (four) times a week.   . polyethylene glycol (MIRALAX / GLYCOLAX) packet Take 17 g by mouth 2 (two) times daily.  Marland Kitchen spironolactone (ALDACTONE) 25 MG tablet Take 1 tablet (25 mg total) by mouth daily.  . [DISCONTINUED] clopidogrel (PLAVIX) 75 MG tablet Take 1 tablet (75 mg total) by mouth daily.  . [DISCONTINUED] lisinopril (PRINIVIL,ZESTRIL) 40 MG tablet Take 1 tablet (40 mg total) by mouth every morning.     Allergies:   Niacin and Oxycodone   Social History   Socioeconomic History  .  Marital status: Married    Spouse name: Not on file  . Number of children: Not on file  . Years of education: Not on file  . Highest education level: Not on file  Occupational History  . Not on file  Social Needs  . Financial resource strain: Not on file  . Food insecurity:    Worry: Not on file    Inability: Not on file  . Transportation needs:    Medical: Not on file    Non-medical: Not on file  Tobacco Use  . Smoking status: Current Every Day Smoker    Packs/day: 0.50    Years: 58.00    Pack years: 29.00    Types: Cigarettes  . Smokeless tobacco: Never Used  Substance and Sexual Activity  . Alcohol use: Yes    Comment: occasional  . Drug use: No  . Sexual activity: Not on file  Lifestyle  . Physical activity:    Days per week: Not on file    Minutes per session: Not on file  . Stress:  Not on file  Relationships  . Social connections:    Talks on phone: Not on file    Gets together: Not on file    Attends religious service: Not on file    Active member of club or organization: Not on file    Attends meetings of clubs or organizations: Not on file    Relationship status: Not on file  Other Topics Concern  . Not on file  Social History Narrative  . Not on file     Family History: The patient's family history includes Cancer in his father; Diabetes in his mother; Heart disease in his maternal grandfather; Hypertension in his mother and sister. ROS:   Please see the history of present illness.    All 14 point review of systems negative except as described per history of present illness  EKGs/Labs/Other Studies Reviewed:      Recent Labs: 12/24/2016: NT-Pro BNP 294 01/13/2017: BUN 24; Creatinine, Ser 0.98; Hemoglobin 12.8; Platelets 179; Potassium 4.7; Sodium 135  Recent Lipid Panel No results found for: CHOL, TRIG, HDL, CHOLHDL, VLDL, LDLCALC, LDLDIRECT  Physical Exam:    VS:  BP 124/68 (BP Location: Right Arm, Patient Position: Sitting, Cuff Size: Normal)   Pulse 74   Ht 5\' 8"  (1.727 m)   Wt 243 lb (110.2 kg)   SpO2 96%   BMI 36.95 kg/m     Wt Readings from Last 3 Encounters:  11/11/17 243 lb (110.2 kg)  04/01/17 246 lb 1.9 oz (111.6 kg)  01/12/17 243 lb (110.2 kg)     GEN:  Well nourished, well developed in no acute distress HEENT: Normal NECK: No JVD; No carotid bruits LYMPHATICS: No lymphadenopathy CARDIAC: RRR, no murmurs, no rubs, no gallops RESPIRATORY:  Clear to auscultation without rales, wheezing or rhonchi  ABDOMEN: Soft, non-tender, non-distended MUSCULOSKELETAL:  No edema; No deformity  SKIN: Warm and dry LOWER EXTREMITIES: no swelling NEUROLOGIC:  Alert and oriented x 3 PSYCHIATRIC:  Normal affect   ASSESSMENT:    1. Coronary artery disease involving native coronary artery of native heart without angina pectoris   2. Dilated  cardiomyopathy ([place])   3. Dyslipidemia    PLAN:    In order of problems listed above:  1. Coronary artery disease doing well from that point review.  Since he does have significant peripheral vascular disease I think he can benefit from Xarelto 2.5 twice daily.  I asked him  to stop Plavix and start taking Xarelto 2.5 twice daily week later.  He also complained of having cough therefore asked him to stop lisinopril start taking losartan. 2. Smoking obviously had a long discussion I strongly recommended him to quit.  He said he will work on that.   Medication Adjustments/Labs and Tests Ordered: Current medicines are reviewed at length with the patient today.  Concerns regarding medicines are outlined above.  No orders of the defined types were placed in this encounter.  Medication changes:  Meds ordered this encounter  Medications  . rivaroxaban (XARELTO) 2.5 MG TABS tablet    Sig: Take 1 tablet (2.5 mg total) by mouth 2 (two) times daily.    Dispense:  60 tablet    Refill:  3  . losartan (COZAAR) 50 MG tablet    Sig: Take 1 tablet (50 mg total) by mouth daily.    Dispense:  30 tablet    Refill:  3    Signed, Park Liter, MD, [facility] 11/11/2017 5:07 PM    [place]

## 2017-11-11 NOTE — Patient Instructions (Addendum)
Medication Instructions:  Your physician has recommended you make the following change in your medication:   STOP clopidogrel (plavix) STOP lisinopril   START rivaroxaban (xarelto) 2.5 mg twice daily: samples provided START losartan (cozaar) 50 mg daily   Labwork: None  Testing/Procedures: None  Follow-Up: Your physician wants you to follow-up in: 5 months. You will receive a reminder letter in the mail two months in advance. If you don't receive a letter, please call our office to schedule the follow-up appointment.   If you need a refill on your cardiac medications before your next appointment, please call your pharmacy.   Thank you for choosing [facility]! Robyne Peers, RN [phone]   Rivaroxaban oral tablets What is this medicine? RIVAROXABAN (ri va ROX a ban) is an anticoagulant (blood thinner). It is used to treat blood clots in the lungs or in the veins. It is also used after knee or hip surgeries to prevent blood clots. It is also used to lower the chance of stroke in people with a medical condition called atrial fibrillation. This medicine may be used for other purposes; ask your health care provider or pharmacist if you have questions. COMMON BRAND NAME(S): Xarelto, Xarelto Starter Pack What should I tell my health care provider before I take this medicine? They need to know if you have any of these conditions: -bleeding disorders -bleeding in the brain -blood in your stools (black or tarry stools) or if you have blood in your vomit -history of stomach bleeding -kidney disease -liver disease -low blood counts, like low white cell, platelet, or red cell counts -recent or planned spinal or epidural procedure -take medicines that treat or prevent blood clots -an unusual or allergic reaction to rivaroxaban, other medicines, foods, dyes, or preservatives -pregnant or trying to get pregnant -breast-feeding How should I use this medicine? Take this  medicine by mouth with a glass of water. Follow the directions on the prescription label. Take your medicine at regular intervals. Do not take it more often than directed. Do not stop taking except on your doctor's advice. Stopping this medicine may increase your risk of a blood clot. Be sure to refill your prescription before you run out of medicine. If you are taking this medicine after hip or knee replacement surgery, take it with or without food. If you are taking this medicine for atrial fibrillation, take it with your evening meal. If you are taking this medicine to treat blood clots, take it with food at the same time each day. If you are unable to swallow your tablet, you may crush the tablet and mix it in applesauce. Then, immediately eat the applesauce. You should eat more food right after you eat the applesauce containing the crushed tablet. Talk to your pediatrician regarding the use of this medicine in children. Special care may be needed. Overdosage: If you think you have taken too much of this medicine contact a poison control center or emergency room at once. NOTE: This medicine is only for you. Do not share this medicine with others. What if I miss a dose? If you take your medicine once a day and miss a dose, take the missed dose as soon as you remember. If you take your medicine twice a day and miss a dose, take the missed dose immediately. In this instance, 2 tablets may be taken at the same time. The next day you should take 1 tablet twice a day as directed. What may interact with this medicine? Do not  take this medicine with any of the following medications: -defibrotide This medicine may also interact with the following medications: -aspirin and aspirin-like medicines -certain antibiotics like erythromycin, azithromycin, and clarithromycin -certain medicines for fungal infections like ketoconazole and itraconazole -certain medicines for irregular heart beat like amiodarone,  quinidine, dronedarone -certain medicines for seizures like carbamazepine, phenytoin -certain medicines that treat or prevent blood clots like warfarin, enoxaparin, and dalteparin -conivaptan -diltiazem -felodipine -indinavir -lopinavir; ritonavir -NSAIDS, medicines for pain and inflammation, like ibuprofen or naproxen -ranolazine -rifampin -ritonavir -SNRIs, medicines for depression, like desvenlafaxine, duloxetine, levomilnacipran, venlafaxine -SSRIs, medicines for depression, like citalopram, escitalopram, fluoxetine, fluvoxamine, paroxetine, sertraline -St. John's wort -verapamil This list may not describe all possible interactions. Give your health care provider a list of all the medicines, herbs, non-prescription drugs, or dietary supplements you use. Also tell them if you smoke, drink alcohol, or use illegal drugs. Some items may interact with your medicine. What should I watch for while using this medicine? Visit your doctor or health care professional for regular checks on your progress. Notify your doctor or health care professional and seek emergency treatment if you develop breathing problems; changes in vision; chest pain; severe, sudden headache; pain, swelling, warmth in the leg; trouble speaking; sudden numbness or weakness of the face, arm or leg. These can be signs that your condition has gotten worse. If you are going to have surgery or other procedure, tell your doctor that you are taking this medicine. What side effects may I notice from receiving this medicine? Side effects that you should report to your doctor or health care professional as soon as possible: -allergic reactions like skin rash, itching or hives, swelling of the face, lips, or tongue -back pain -redness, blistering, peeling or loosening of the skin, including inside the mouth -signs and symptoms of bleeding such as bloody or black, tarry stools; red or dark-brown urine; spitting up blood or brown  material that looks like coffee grounds; red spots on the skin; unusual bruising or bleeding from the eye, gums, or nose Side effects that usually do not require medical attention (report to your doctor or health care professional if they continue or are bothersome): -dizziness -muscle pain This list may not describe all possible side effects. Call your doctor for medical advice about side effects. You may report side effects to FDA at 1-800-FDA-1088. Where should I keep my medicine? Keep out of the reach of children. Store at room temperature between 15 and 30 degrees C (59 and 86 degrees F). Throw away any unused medicine after the expiration date. NOTE: This sheet is a summary. It may not cover all possible information. If you have questions about this medicine, talk to your doctor, pharmacist, or health care provider.  2018 Elsevier/Gold Standard (2015-11-27 16:29:33)   Losartan tablets What is this medicine? LOSARTAN (loe SAR tan) is used to treat high blood pressure and to reduce the risk of stroke in certain patients. This drug also slows the progression of kidney disease in patients with diabetes. This medicine may be used for other purposes; ask your health care provider or pharmacist if you have questions. COMMON BRAND NAME(S): Cozaar What should I tell my health care provider before I take this medicine? They need to know if you have any of these conditions: -heart failure -kidney or liver disease -an unusual or allergic reaction to losartan, other medicines, foods, dyes, or preservatives -pregnant or trying to get pregnant -breast-feeding How should I use this medicine? Take this medicine  by mouth with a glass of water. Follow the directions on the prescription label. This medicine can be taken with or without food. Take your doses at regular intervals. Do not take your medicine more often than directed. Talk to your pediatrician regarding the use of this medicine in children.  Special care may be needed. Overdosage: If you think you have taken too much of this medicine contact a poison control center or emergency room at once. NOTE: This medicine is only for you. Do not share this medicine with others. What if I miss a dose? If you miss a dose, take it as soon as you can. If it is almost time for your next dose, take only that dose. Do not take double or extra doses. What may interact with this medicine? -blood pressure medicines -diuretics, especially triamterene, spironolactone, or amiloride -fluconazole -NSAIDs, medicines for pain and inflammation, like ibuprofen or naproxen -potassium salts or potassium supplements -rifampin This list may not describe all possible interactions. Give your health care provider a list of all the medicines, herbs, non-prescription drugs, or dietary supplements you use. Also tell them if you smoke, drink alcohol, or use illegal drugs. Some items may interact with your medicine. What should I watch for while using this medicine? Visit your doctor or health care professional for regular checks on your progress. Check your blood pressure as directed. Ask your doctor or health care professional what your blood pressure should be and when you should contact him or her. Call your doctor or health care professional if you notice an irregular or fast heart beat. Women should inform their doctor if they wish to become pregnant or think they might be pregnant. There is a potential for serious side effects to an unborn child, particularly in the second or third trimester. Talk to your health care professional or pharmacist for more information. You may get drowsy or dizzy. Do not drive, use machinery, or do anything that needs mental alertness until you know how this drug affects you. Do not stand or sit up quickly, especially if you are an older patient. This reduces the risk of dizzy or fainting spells. Alcohol can make you more drowsy and dizzy.  Avoid alcoholic drinks. Avoid salt substitutes unless you are told otherwise by your doctor or health care professional. Do not treat yourself for coughs, colds, or pain while you are taking this medicine without asking your doctor or health care professional for advice. Some ingredients may increase your blood pressure. What side effects may I notice from receiving this medicine? Side effects that you should report to your doctor or health care professional as soon as possible: -confusion, dizziness, light headedness or fainting spells -decreased amount of urine passed -difficulty breathing or swallowing, hoarseness, or tightening of the throat -fast or irregular heart beat, palpitations, or chest pain -skin rash, itching -swelling of your face, lips, tongue, hands, or feet Side effects that usually do not require medical attention (report to your doctor or health care professional if they continue or are bothersome): -cough -decreased sexual function or desire -headache -nasal congestion or stuffiness -nausea or stomach pain -sore or cramping muscles This list may not describe all possible side effects. Call your doctor for medical advice about side effects. You may report side effects to FDA at 1-800-FDA-1088. Where should I keep my medicine? Keep out of the reach of children. Store at room temperature between 15 and 30 degrees C (59 and 86 degrees F). Protect from light. Keep container  tightly closed. Throw away any unused medicine after the expiration date. NOTE: This sheet is a summary. It may not cover all possible information. If you have questions about this medicine, talk to your doctor, pharmacist, or health care provider.  2018 Elsevier/Gold Standard (2007-05-20 16:42:18)

## 2017-11-30 ENCOUNTER — Other Ambulatory Visit: Payer: Self-pay | Admitting: Cardiology

## 2017-12-24 DIAGNOSIS — R748 Abnormal levels of other serum enzymes: Secondary | ICD-10-CM | POA: Diagnosis not present

## 2017-12-24 DIAGNOSIS — E1165 Type 2 diabetes mellitus with hyperglycemia: Secondary | ICD-10-CM | POA: Diagnosis not present

## 2017-12-24 DIAGNOSIS — I2510 Atherosclerotic heart disease of native coronary artery without angina pectoris: Secondary | ICD-10-CM | POA: Diagnosis not present

## 2017-12-24 DIAGNOSIS — Z23 Encounter for immunization: Secondary | ICD-10-CM | POA: Diagnosis not present

## 2017-12-24 DIAGNOSIS — I10 Essential (primary) hypertension: Secondary | ICD-10-CM | POA: Diagnosis not present

## 2017-12-24 DIAGNOSIS — R600 Localized edema: Secondary | ICD-10-CM | POA: Diagnosis not present

## 2017-12-24 DIAGNOSIS — E119 Type 2 diabetes mellitus without complications: Secondary | ICD-10-CM | POA: Diagnosis not present

## 2017-12-24 DIAGNOSIS — E782 Mixed hyperlipidemia: Secondary | ICD-10-CM | POA: Diagnosis not present

## 2018-01-11 DIAGNOSIS — R748 Abnormal levels of other serum enzymes: Secondary | ICD-10-CM | POA: Diagnosis not present

## 2018-01-19 DIAGNOSIS — K760 Fatty (change of) liver, not elsewhere classified: Secondary | ICD-10-CM | POA: Diagnosis not present

## 2018-01-19 DIAGNOSIS — R748 Abnormal levels of other serum enzymes: Secondary | ICD-10-CM | POA: Diagnosis not present

## 2018-01-30 ENCOUNTER — Other Ambulatory Visit: Payer: Self-pay | Admitting: Cardiology

## 2018-02-01 ENCOUNTER — Encounter: Payer: Self-pay | Admitting: Cardiology

## 2018-02-01 ENCOUNTER — Ambulatory Visit (INDEPENDENT_AMBULATORY_CARE_PROVIDER_SITE_OTHER): Payer: PPO | Admitting: Cardiology

## 2018-02-01 ENCOUNTER — Telehealth: Payer: Self-pay | Admitting: Emergency Medicine

## 2018-02-01 ENCOUNTER — Telehealth: Payer: Self-pay | Admitting: Cardiology

## 2018-02-01 VITALS — BP 116/66 | HR 64 | Resp 16

## 2018-02-01 DIAGNOSIS — R079 Chest pain, unspecified: Secondary | ICD-10-CM | POA: Diagnosis not present

## 2018-02-01 DIAGNOSIS — I2510 Atherosclerotic heart disease of native coronary artery without angina pectoris: Secondary | ICD-10-CM

## 2018-02-01 DIAGNOSIS — I10 Essential (primary) hypertension: Secondary | ICD-10-CM

## 2018-02-01 DIAGNOSIS — E785 Hyperlipidemia, unspecified: Secondary | ICD-10-CM | POA: Diagnosis not present

## 2018-02-01 MED ORDER — NITROGLYCERIN 0.4 MG SL SUBL: 0.4000 mg | SUBLINGUAL_TABLET | SUBLINGUAL | 11 refills | Status: AC | PRN

## 2018-02-01 MED ORDER — RANOLAZINE ER 500 MG PO TB12: 500.0000 mg | ORAL_TABLET | Freq: Two times a day (BID) | ORAL | 1 refills | Status: DC

## 2018-02-01 NOTE — Telephone Encounter (Signed)
Patient informed to have an ekg done today per Dr. Agustin Cree. Patient verbally understands and will be here at 2pm

## 2018-02-01 NOTE — Telephone Encounter (Signed)
Opened in error please see previous phone encounter

## 2018-02-01 NOTE — Telephone Encounter (Signed)
His heart rate is running real high

## 2018-02-01 NOTE — Progress Notes (Signed)
Cardiology Office Note:    Date:  02/01/2018   ID:  Tony King, DOB 1946-03-20, MRN [id number]  PCP:  Algis Greenhouse, MD  Cardiologist:  Jenne Campus, MD    Referring MD: Algis Greenhouse, MD   No chief complaint on file. I have some neck pain  History of Present Illness:    Tony King is a 72 y.o. male with coronary artery disease cardiomyopathy comes today to office he requested to be seen he has been complaining of having pain in the neck every day in the morning he said he wakes up to have the pain then gradual pain goes away he said this is somewhat similar to the pain that he had before when he had his coronary artery disease problem.  With talking length about what to do with the situation we elected to put him on ranolazine 500 mg twice daily as well as schedule him for Lexiscan.  Past Medical History:  Diagnosis Date  . Anginal pain (HCC)    hx of before CABG  . Arthritis   . Atherosclerosis of both lower extremities with intermittent claudication ([place])   . Complication of anesthesia    "insides did not start working after surgery" ; "they had to pump my stomach with a tube down my throat"   . Coronary artery disease   . Diabetes mellitus without complication ([place])    type 2  . Dilated cardiomyopathy ([place])   . GERD (gastroesophageal reflux disease)   . H/O bronchitis   . H/O pleurisy 1968  . Headache    occasional  . Hypercholesteremia   . Hypertension    under control  . Pneumonia    hx of  . Shortness of breath   . Tobacco abuse     Past Surgical History:  Procedure Laterality Date  . APPENDECTOMY  ~age 21  . CARDIAC CATHETERIZATION     x3  . CHOLECYSTECTOMY  ~2013  . CORONARY ARTERY BYPASS GRAFT  2003   triple  . EXPLORATORY LAPAROTOMY  ~age 21   with appendectomy  . HERNIA REPAIR     with cholecystectomy  . JOINT REPLACEMENT Bilateral    knees  . TOTAL HIP ARTHROPLASTY Left 01/12/2017   Procedure: LEFT TOTAL HIP ARTHROPLASTY  ANTERIOR APPROACH;  Surgeon: Paralee Cancel, MD;  Location: [facility];  Service: Orthopedics;  Laterality: Left;  70 mins  . TOTAL KNEE ARTHROPLASTY      Current Medications: No outpatient medications have been marked as taking for the 02/01/18 encounter (Office Visit) with Park Liter, MD.     Allergies:   Niacin and Oxycodone   Social History   Socioeconomic History  . Marital status: Married    Spouse name: Not on file  . Number of children: Not on file  . Years of education: Not on file  . Highest education level: Not on file  Occupational History  . Not on file  Social Needs  . Financial resource strain: Not on file  . Food insecurity:    Worry: Not on file    Inability: Not on file  . Transportation needs:    Medical: Not on file    Non-medical: Not on file  Tobacco Use  . Smoking status: Current Every Day Smoker    Packs/day: 0.50    Years: 58.00    Pack years: 29.00    Types: Cigarettes  . Smokeless tobacco: Never Used  Substance and Sexual Activity  . Alcohol use:  Yes    Comment: occasional  . Drug use: No  . Sexual activity: Not on file  Lifestyle  . Physical activity:    Days per week: Not on file    Minutes per session: Not on file  . Stress: Not on file  Relationships  . Social connections:    Talks on phone: Not on file    Gets together: Not on file    Attends religious service: Not on file    Active member of club or organization: Not on file    Attends meetings of clubs or organizations: Not on file    Relationship status: Not on file  Other Topics Concern  . Not on file  Social History Narrative  . Not on file     Family History: The patient's family history includes Cancer in his father; Diabetes in his mother; Heart disease in his maternal grandfather; Hypertension in his mother and sister. ROS:   Please see the history of present illness.    All 14 point review of systems negative except as described per history of present  illness  EKGs/Labs/Other Studies Reviewed:      Recent Labs: No results found for requested labs within last 8760 hours.  Recent Lipid Panel No results found for: CHOL, TRIG, HDL, CHOLHDL, VLDL, LDLCALC, LDLDIRECT  Physical Exam:    VS:  BP 116/66   Pulse 64   Resp 16   SpO2 96%     Wt Readings from Last 3 Encounters:  11/11/17 243 lb (110.2 kg)  04/01/17 246 lb 1.9 oz (111.6 kg)  01/12/17 243 lb (110.2 kg)     GEN:  Well nourished, well developed in no acute distress HEENT: Normal NECK: No JVD; No carotid bruits LYMPHATICS: No lymphadenopathy CARDIAC: RRR, no murmurs, no rubs, no gallops RESPIRATORY:  Clear to auscultation without rales, wheezing or rhonchi  ABDOMEN: Soft, non-tender, non-distended MUSCULOSKELETAL:  No edema; No deformity  SKIN: Warm and dry LOWER EXTREMITIES: no swelling NEUROLOGIC:  Alert and oriented x 3 PSYCHIATRIC:  Normal affect   ASSESSMENT:    1. Chest pain, unspecified type   2. Atherosclerosis of native coronary artery of native heart without angina pectoris   3. Dyslipidemia   4. Essential hypertension    PLAN:    In order of problems listed above:  1. Coronary artery disease symptoms as described above plan will be to do Lexiscan as well as initiate ranolazine twice daily. 2. Dyslipidemia on appropriate medication which I will continue. 3. Essential hypertension blood pressure well controlled continue present management.   Medication Adjustments/Labs and Tests Ordered: Current medicines are reviewed at length with the patient today.  Concerns regarding medicines are outlined above.  Orders Placed This Encounter  Procedures  . MYOCARDIAL PERFUSION IMAGING  . EKG 12-Lead   Medication changes:  Meds ordered this encounter  Medications  . ranolazine (RANEXA) 500 MG 12 hr tablet    Sig: Take 1 tablet (500 mg total) by mouth 2 (two) times daily.    Dispense:  60 tablet    Refill:  1  . nitroGLYCERIN (NITROSTAT) 0.4 MG SL  tablet    Sig: Place 1 tablet (0.4 mg total) under the tongue every 5 (five) minutes as needed for chest pain.    Dispense:  25 tablet    Refill:  11    Signed, Park Liter, MD, [facility] 02/01/2018 4:47 PM    [place]

## 2018-02-01 NOTE — Telephone Encounter (Signed)
Patient reports his heart rate has been intermittently high for the past two weeks. Patient has seen heart rate as high as 120. When he notices his heart rate rising he reports a tight feeling in his throat that is painful along with left sided chest pain that lasts from 5-20 min. He reports an Excedrin relieves this pain. Will consult with Dr. Agustin Cree. Patient advised that if having active chest pain to go to the emergency department, patient verbally understands.

## 2018-02-01 NOTE — Patient Instructions (Signed)
Medication Instructions:  Your physician has recommended you make the following change in your medication:   START: Ranolazine 500 mg twice daily  Take as needed for chest pain: Nitroglycerin 0.4 mg sublingual (under your tongue) as needed for chest pain. If experiencing chest pain, stop what you are doing and sit down. Take 1 nitroglycerin and wait 5 minutes. If chest pain continues, take another nitroglycerin and wait 5 minutes. If chest pain does not subside, take 1 more nitroglycerin and dial 911. You make take a total of 3 nitroglycerin in a 15 minute time frame.    If you need a refill on your cardiac medications before your next appointment, please call your pharmacy.   Lab work: None.  If you have labs (blood work) drawn today and your tests are completely normal, you will receive your results only by: Marland Kitchen MyChart Message (if you have MyChart) OR . A paper copy in the mail If you have any lab test that is abnormal or we need to change your treatment, we will call you to review the results.  Testing/Procedures: Your physician has requested that you have a lexiscan myoview. For further information please visit [email or web address]. Please follow instruction sheet, as given.    Follow-Up: At [facility], you and your health needs are our priority.  As part of our continuing mission to provide you with exceptional heart care, we have created designated Provider Care Teams.  These Care Teams include your primary Cardiologist (physician) and Advanced Practice Providers (APPs -  Physician Assistants and Nurse Practitioners) who all work together to provide you with the care you need, when you need it. You will need a follow up appointment in 1 months.  Please call our office 2 months in advance to schedule this appointment.  You may see No primary care provider on file. or another member of our [employer] Provider Team in [place]: Shirlee More, MD . Jyl Heinz, MD  Any Other  Special Instructions Will Be Listed Below (If Applicable).  Cardiac Nuclear Scan A cardiac nuclear scan is a test that measures blood flow to the heart when a person is resting and when he or she is exercising. The test looks for problems such as:  Not enough blood reaching a portion of the heart.  The heart muscle not working normally.  You may need this test if:  You have heart disease.  You have had abnormal lab results.  You have had heart surgery or angioplasty.  You have chest pain.  You have shortness of breath.  In this test, a radioactive dye (tracer) is injected into your bloodstream. After the tracer has traveled to your heart, an imaging device is used to measure how much of the tracer is absorbed by or distributed to various areas of your heart. This procedure is usually done at a hospital and takes 2-4 hours. Tell a health care provider about:  Any allergies you have.  All medicines you are taking, including vitamins, herbs, eye drops, creams, and over-the-counter medicines.  Any problems you or family members have had with the use of anesthetic medicines.  Any blood disorders you have.  Any surgeries you have had.  Any medical conditions you have.  Whether you are pregnant or may be pregnant. What are the risks? Generally, this is a safe procedure. However, problems may occur, including:  Serious chest pain and heart attack. This is only a risk if the stress portion of the test is done.  Rapid heartbeat.  Sensation of warmth in your chest. This usually passes quickly.  What happens before the procedure?  Ask your health care provider about changing or stopping your regular medicines. This is especially important if you are taking diabetes medicines or blood thinners.  Remove your jewelry on the day of the procedure. What happens during the procedure?  An IV tube will be inserted into one of your veins.  Your health care provider will inject a  small amount of radioactive tracer through the tube.  You will wait for 20-40 minutes while the tracer travels through your bloodstream.  Your heart activity will be monitored with an electrocardiogram (ECG).  You will lie down on an exam table.  Images of your heart will be taken for about 15-20 minutes.  You may be asked to exercise on a treadmill or stationary bike. While you exercise, your heart's activity will be monitored with an ECG, and your blood pressure will be checked. If you are unable to exercise, you may be given a medicine to increase blood flow to parts of your heart.  When blood flow to your heart has peaked, a tracer will again be injected through the IV tube.  After 20-40 minutes, you will get back on the exam table and have more images taken of your heart.  When the procedure is over, your IV tube will be removed. The procedure may vary among health care providers and hospitals. Depending on the type of tracer used, scans may need to be repeated 3-4 hours later. What happens after the procedure?  Unless your health care provider tells you otherwise, you may return to your normal schedule, including diet, activities, and medicines.  Unless your health care provider tells you otherwise, you may increase your fluid intake. This will help flush the contrast dye from your body. Drink enough fluid to keep your urine clear or pale yellow.  It is up to you to get your test results. Ask your health care provider, or the department that is doing the test, when your results will be ready. Summary  A cardiac nuclear scan measures the blood flow to the heart when a person is resting and when he or she is exercising.  You may need this test if you are at risk for heart disease.  Tell your health care provider if you are pregnant.  Unless your health care provider tells you otherwise, increase your fluid intake. This will help flush the contrast dye from your body. Drink enough  fluid to keep your urine clear or pale yellow. This information is not intended to replace advice given to you by your health care provider. Make sure you discuss any questions you have with your health care provider. Document Released: 04/03/2004 Document Revised: 03/11/2016 Document Reviewed: 02/15/2013 Elsevier Interactive Patient Education  2017 [place].  Nitroglycerin sublingual tablets What is this medicine? NITROGLYCERIN (nye troe GLI ser in) is a type of vasodilator. It relaxes blood vessels, increasing the blood and oxygen supply to your heart. This medicine is used to relieve chest pain caused by angina. It is also used to prevent chest pain before activities like climbing stairs, going outdoors in cold weather, or sexual activity. This medicine may be used for other purposes; ask your health care provider or pharmacist if you have questions. COMMON BRAND NAME(S): Nitroquick, Nitrostat, Nitrotab What should I tell my health care provider before I take this medicine? They need to know if you have any of these conditions: -anemia -head injury,  recent stroke, or bleeding in the brain -liver disease -previous heart attack -an unusual or allergic reaction to nitroglycerin, other medicines, foods, dyes, or preservatives -pregnant or trying to get pregnant -breast-feeding How should I use this medicine? Take this medicine by mouth as needed. At the first sign of an angina attack (chest pain or tightness) place one tablet under your tongue. You can also take this medicine 5 to 10 minutes before an event likely to produce chest pain. Follow the directions on the prescription label. Let the tablet dissolve under the tongue. Do not swallow whole. Replace the dose if you accidentally swallow it. It will help if your mouth is not dry. Saliva around the tablet will help it to dissolve more quickly. Do not eat or drink, smoke or chew tobacco while a tablet is dissolving. If you are not better  within 5 minutes after taking ONE dose of nitroglycerin, call 9-1-1 immediately to seek emergency medical care. Do not take more than 3 nitroglycerin tablets over 15 minutes. If you take this medicine often to relieve symptoms of angina, your doctor or health care professional may provide you with different instructions to manage your symptoms. If symptoms do not go away after following these instructions, it is important to call 9-1-1 immediately. Do not take more than 3 nitroglycerin tablets over 15 minutes. Talk to your pediatrician regarding the use of this medicine in children. Special care may be needed. Overdosage: If you think you have taken too much of this medicine contact a poison control center or emergency room at once. NOTE: This medicine is only for you. Do not share this medicine with others. What if I miss a dose? This does not apply. This medicine is only used as needed. What may interact with this medicine? Do not take this medicine with any of the following medications: -certain migraine medicines like ergotamine and dihydroergotamine (DHE) -medicines used to treat erectile dysfunction like sildenafil, tadalafil, and vardenafil -riociguat This medicine may also interact with the following medications: -alteplase -aspirin -heparin -medicines for high blood pressure -medicines for mental depression -other medicines used to treat angina -phenothiazines like chlorpromazine, mesoridazine, prochlorperazine, thioridazine This list may not describe all possible interactions. Give your health care provider a list of all the medicines, herbs, non-prescription drugs, or dietary supplements you use. Also tell them if you smoke, drink alcohol, or use illegal drugs. Some items may interact with your medicine. What should I watch for while using this medicine? Tell your doctor or health care professional if you feel your medicine is no longer working. Keep this medicine with you at all  times. Sit or lie down when you take your medicine to prevent falling if you feel dizzy or faint after using it. Try to remain calm. This will help you to feel better faster. If you feel dizzy, take several deep breaths and lie down with your feet propped up, or bend forward with your head resting between your knees. You may get drowsy or dizzy. Do not drive, use machinery, or do anything that needs mental alertness until you know how this drug affects you. Do not stand or sit up quickly, especially if you are an older patient. This reduces the risk of dizzy or fainting spells. Alcohol can make you more drowsy and dizzy. Avoid alcoholic drinks. Do not treat yourself for coughs, colds, or pain while you are taking this medicine without asking your doctor or health care professional for advice. Some ingredients may increase your blood  pressure. What side effects may I notice from receiving this medicine? Side effects that you should report to your doctor or health care professional as soon as possible: -blurred vision -dry mouth -skin rash -sweating -the feeling of extreme pressure in the head -unusually weak or tired Side effects that usually do not require medical attention (report to your doctor or health care professional if they continue or are bothersome): -flushing of the face or neck -headache -irregular heartbeat, palpitations -nausea, vomiting This list may not describe all possible side effects. Call your doctor for medical advice about side effects. You may report side effects to FDA at 1-800-FDA-1088. Where should I keep my medicine? Keep out of the reach of children. Store at room temperature between 20 and 25 degrees C (68 and 77 degrees F). Store in [employer]. Protect from light and moisture. Keep tightly closed. Throw away any unused medicine after the expiration date. NOTE: This sheet is a summary. It may not cover all possible information. If you have questions about  this medicine, talk to your doctor, pharmacist, or health care provider.  2018 Elsevier/Gold Standard (2013-01-05 17:57:36)

## 2018-02-03 ENCOUNTER — Telehealth: Payer: Self-pay | Admitting: Cardiology

## 2018-02-03 MED ORDER — RANOLAZINE ER 500 MG PO TB12: 500.0000 mg | ORAL_TABLET | Freq: Two times a day (BID) | ORAL | 1 refills | Status: AC

## 2018-02-03 NOTE — Telephone Encounter (Signed)
No one has the ranolazine 500 mg that Dr Raliegh Ip wanted to start him on

## 2018-02-03 NOTE — Telephone Encounter (Signed)
Patient called and states that the [facility] has the meds to call it in there.

## 2018-02-03 NOTE — Addendum Note (Signed)
Addended by: Ashok Norris on: 02/03/2018 02:08 PM   Modules accepted: Orders

## 2018-02-03 NOTE — Telephone Encounter (Signed)
Patient will call other pharmacies and let us know which one the medication needs to be sent to.

## 2018-02-03 NOTE — Telephone Encounter (Signed)
Medication sent to [facility]. Patient aware.

## 2018-02-04 NOTE — Telephone Encounter (Signed)
error 

## 2018-02-10 ENCOUNTER — Other Ambulatory Visit (HOSPITAL_COMMUNITY): Payer: Self-pay | Admitting: Gastroenterology

## 2018-02-10 DIAGNOSIS — Z7901 Long term (current) use of anticoagulants: Secondary | ICD-10-CM | POA: Diagnosis not present

## 2018-02-10 DIAGNOSIS — Z808 Family history of malignant neoplasm of other organs or systems: Secondary | ICD-10-CM

## 2018-02-10 DIAGNOSIS — Z86010 Personal history of colonic polyps: Secondary | ICD-10-CM | POA: Diagnosis not present

## 2018-02-10 DIAGNOSIS — R748 Abnormal levels of other serum enzymes: Secondary | ICD-10-CM

## 2018-02-18 ENCOUNTER — Encounter (HOSPITAL_COMMUNITY): Payer: PPO

## 2018-02-18 ENCOUNTER — Other Ambulatory Visit (HOSPITAL_COMMUNITY): Payer: PPO

## 2018-02-22 ENCOUNTER — Encounter (HOSPITAL_COMMUNITY)
Admission: RE | Admit: 2018-02-22 | Discharge: 2018-02-22 | Disposition: A | Discharge status: Home / Self Care | Payer: PPO | Source: Ambulatory Visit | Attending: Gastroenterology | Admitting: Gastroenterology

## 2018-02-22 ENCOUNTER — Other Ambulatory Visit: Payer: Self-pay

## 2018-02-22 ENCOUNTER — Telehealth: Payer: Self-pay | Admitting: Emergency Medicine

## 2018-02-22 DIAGNOSIS — M533 Sacrococcygeal disorders, not elsewhere classified: Secondary | ICD-10-CM | POA: Diagnosis not present

## 2018-02-22 DIAGNOSIS — Z808 Family history of malignant neoplasm of other organs or systems: Secondary | ICD-10-CM | POA: Diagnosis not present

## 2018-02-22 DIAGNOSIS — R748 Abnormal levels of other serum enzymes: Secondary | ICD-10-CM | POA: Diagnosis not present

## 2018-02-22 MED ORDER — TECHNETIUM TC 99M MEDRONATE IV KIT
20.0000 | PACK | Freq: Once | INTRAVENOUS | Status: AC | PRN
  Administered 2018-02-22: 20 via INTRAVENOUS

## 2018-02-22 MED ORDER — METOPROLOL TARTRATE 50 MG PO TABS: 50.0000 mg | ORAL_TABLET | Freq: Two times a day (BID) | ORAL | 2 refills | Status: AC

## 2018-02-22 NOTE — Telephone Encounter (Signed)
Called to confirm patient no longer takes lisinopril for a refill request that we received. He does not. However he did need metoprolol 50 mg twice daily refilled. Medication refilled.

## 2018-02-28 DIAGNOSIS — M25551 Pain in right hip: Secondary | ICD-10-CM | POA: Diagnosis not present

## 2018-02-28 DIAGNOSIS — R748 Abnormal levels of other serum enzymes: Secondary | ICD-10-CM | POA: Diagnosis not present

## 2018-02-28 DIAGNOSIS — R590 Localized enlarged lymph nodes: Secondary | ICD-10-CM | POA: Diagnosis not present

## 2018-02-28 DIAGNOSIS — R948 Abnormal results of function studies of other organs and systems: Secondary | ICD-10-CM | POA: Diagnosis not present

## 2018-03-01 DIAGNOSIS — R590 Localized enlarged lymph nodes: Secondary | ICD-10-CM | POA: Diagnosis not present

## 2018-03-01 DIAGNOSIS — R948 Abnormal results of function studies of other organs and systems: Secondary | ICD-10-CM | POA: Diagnosis not present

## 2018-03-01 DIAGNOSIS — M25551 Pain in right hip: Secondary | ICD-10-CM | POA: Diagnosis not present

## 2018-03-01 DIAGNOSIS — R05 Cough: Secondary | ICD-10-CM | POA: Diagnosis not present

## 2018-03-02 ENCOUNTER — Other Ambulatory Visit: Payer: Self-pay | Admitting: Cardiology

## 2018-03-03 ENCOUNTER — Ambulatory Visit: Payer: PPO | Admitting: Cardiology

## 2018-03-03 ENCOUNTER — Telehealth (HOSPITAL_COMMUNITY): Payer: Self-pay | Admitting: *Deleted

## 2018-03-03 NOTE — Telephone Encounter (Signed)
Patient given detailed instructions per Myocardial Perfusion Study Information Sheet for the test on 03/08/18 Patient notified to arrive 15 minutes early and that it is imperative to arrive on time for appointment to keep from having the test rescheduled.  If you need to cancel or reschedule your appointment, please call the office within 24 hours of your appointment. . Patient verbalized understanding. Kirstie Peri

## 2018-03-04 ENCOUNTER — Other Ambulatory Visit: Payer: Self-pay | Admitting: Cardiology

## 2018-03-08 ENCOUNTER — Ambulatory Visit (INDEPENDENT_AMBULATORY_CARE_PROVIDER_SITE_OTHER): Payer: PPO

## 2018-03-08 VITALS — Ht 68.0 in | Wt 243.0 lb

## 2018-03-08 DIAGNOSIS — E1151 Type 2 diabetes mellitus with diabetic peripheral angiopathy without gangrene: Secondary | ICD-10-CM

## 2018-03-08 DIAGNOSIS — R079 Chest pain, unspecified: Secondary | ICD-10-CM | POA: Diagnosis not present

## 2018-03-08 LAB — MYOCARDIAL PERFUSION IMAGING
CHL CUP RESTING HR STRESS: 58 {beats}/min
LV sys vol: 111 mL
LVDIAVOL: 163 mL (ref 62–150)
Peak HR: 81 {beats}/min
SDS: 3
SRS: 8
SSS: 10
TID: 1.15

## 2018-03-08 MED ORDER — REGADENOSON 0.4 MG/5ML IV SOLN
0.4000 mg | Freq: Once | INTRAVENOUS | Status: AC
  Administered 2018-03-08: 0.4 mg via INTRAVENOUS

## 2018-03-08 MED ORDER — TECHNETIUM TC 99M TETROFOSMIN IV KIT
30.0000 | PACK | Freq: Once | INTRAVENOUS | Status: AC | PRN
  Administered 2018-03-08: 30 via INTRAVENOUS

## 2018-03-08 MED ORDER — TECHNETIUM TC 99M TETROFOSMIN IV KIT
9.7000 | PACK | Freq: Once | INTRAVENOUS | Status: AC | PRN
  Administered 2018-03-08: 9.7 via INTRAVENOUS

## 2018-03-09 DIAGNOSIS — C7951 Secondary malignant neoplasm of bone: Secondary | ICD-10-CM | POA: Diagnosis not present

## 2018-03-09 DIAGNOSIS — C801 Malignant (primary) neoplasm, unspecified: Secondary | ICD-10-CM | POA: Diagnosis not present

## 2018-03-10 DIAGNOSIS — L89152 Pressure ulcer of sacral region, stage 2: Secondary | ICD-10-CM | POA: Diagnosis not present

## 2018-03-10 DIAGNOSIS — E119 Type 2 diabetes mellitus without complications: Secondary | ICD-10-CM | POA: Diagnosis not present

## 2018-03-10 DIAGNOSIS — I110 Hypertensive heart disease with heart failure: Secondary | ICD-10-CM | POA: Diagnosis not present

## 2018-03-10 DIAGNOSIS — I5030 Unspecified diastolic (congestive) heart failure: Secondary | ICD-10-CM | POA: Diagnosis not present

## 2018-03-10 DIAGNOSIS — F17210 Nicotine dependence, cigarettes, uncomplicated: Secondary | ICD-10-CM | POA: Diagnosis not present

## 2018-03-10 DIAGNOSIS — Z7984 Long term (current) use of oral hypoglycemic drugs: Secondary | ICD-10-CM | POA: Diagnosis not present

## 2018-03-14 ENCOUNTER — Ambulatory Visit (INDEPENDENT_AMBULATORY_CARE_PROVIDER_SITE_OTHER): Payer: PPO | Admitting: Cardiology

## 2018-03-14 ENCOUNTER — Encounter: Payer: Self-pay | Admitting: Cardiology

## 2018-03-14 VITALS — BP 100/48 | HR 73 | Ht 68.0 in | Wt 232.2 lb

## 2018-03-14 DIAGNOSIS — I420 Dilated cardiomyopathy: Secondary | ICD-10-CM

## 2018-03-14 DIAGNOSIS — I10 Essential (primary) hypertension: Secondary | ICD-10-CM

## 2018-03-14 DIAGNOSIS — M7989 Other specified soft tissue disorders: Secondary | ICD-10-CM | POA: Diagnosis not present

## 2018-03-14 DIAGNOSIS — E1151 Type 2 diabetes mellitus with diabetic peripheral angiopathy without gangrene: Secondary | ICD-10-CM | POA: Diagnosis not present

## 2018-03-14 DIAGNOSIS — I2510 Atherosclerotic heart disease of native coronary artery without angina pectoris: Secondary | ICD-10-CM

## 2018-03-14 DIAGNOSIS — Z951 Presence of aortocoronary bypass graft: Secondary | ICD-10-CM

## 2018-03-14 DIAGNOSIS — C7951 Secondary malignant neoplasm of bone: Secondary | ICD-10-CM | POA: Diagnosis not present

## 2018-03-14 DIAGNOSIS — E782 Mixed hyperlipidemia: Secondary | ICD-10-CM

## 2018-03-14 DIAGNOSIS — C801 Malignant (primary) neoplasm, unspecified: Secondary | ICD-10-CM | POA: Diagnosis not present

## 2018-03-14 NOTE — Progress Notes (Signed)
Cardiology Office Note:    Date:  03/14/2018   ID:  BUZZ AXEL, DOB April 27, 1945, MRN [id number]  PCP:  Algis Greenhouse, MD  Cardiologist:  Jenne Campus, MD    Referring MD: Algis Greenhouse, MD   Chief Complaint  Patient presents with  . Follow-up  Not doing well have cancer  History of Present Illness:    Tony King is a 72 y.o. male with coronary artery disease dilated cardiomyopathy essential hypertension diabetes comes today to my office to discuss results with stress test.  He did have some atypical symptoms and elected to do stress test which showed small area of ischemia valving basal inferior wall overall cardiac wise he is doing well he said to give him ranolazine which seems to be helping however because of him not feeling well he did have bone scan which showed diffuse what appears to be metastatic lesions.  It looks like it may be prostate problem he did have some laboratory test done in [place] clarify.  This being aggressively investigated he is going to have PET scan next week.  Cardiac wise appears to be doing well obviously saddened by the diagnosis.  Past Medical History:  Diagnosis Date  . Anginal pain (HCC)    hx of before CABG  . Arthritis   . Atherosclerosis of both lower extremities with intermittent claudication ([place])   . Complication of anesthesia    "insides did not start working after surgery" ; "they had to pump my stomach with a tube down my throat"   . Coronary artery disease   . Diabetes mellitus without complication ([place])    type 2  . Dilated cardiomyopathy ([place])   . GERD (gastroesophageal reflux disease)   . H/O bronchitis   . H/O pleurisy 1968  . Headache    occasional  . Hypercholesteremia   . Hypertension    under control  . Pneumonia    hx of  . Shortness of breath   . Tobacco abuse     Past Surgical History:  Procedure Laterality Date  . APPENDECTOMY  ~age 69  . CARDIAC CATHETERIZATION     x3  . CHOLECYSTECTOMY  ~2013    . CORONARY ARTERY BYPASS GRAFT  2003   triple  . EXPLORATORY LAPAROTOMY  ~age 69   with appendectomy  . HERNIA REPAIR     with cholecystectomy  . JOINT REPLACEMENT Bilateral    knees  . TOTAL HIP ARTHROPLASTY Left 01/12/2017   Procedure: LEFT TOTAL HIP ARTHROPLASTY ANTERIOR APPROACH;  Surgeon: Paralee Cancel, MD;  Location: [facility];  Service: Orthopedics;  Laterality: Left;  70 mins  . TOTAL KNEE ARTHROPLASTY      Current Medications: Current Meds  Medication Sig  . aspirin EC 81 MG tablet Take 81 mg by mouth daily.  Marland Kitchen docusate sodium (COLACE) 100 MG capsule Take 1 capsule (100 mg total) by mouth 2 (two) times daily.  . furosemide (LASIX) 40 MG tablet Take 1.5 tablets in the am (60 mg) and take 1 tablet in the pm ( 40 mg ). (Patient taking differently: Take 80 mg by mouth See admin instructions. Take 80 mg in the morning)  . JARDIANCE 25 MG TABS tablet Take 25 mg by mouth daily.   Marland Kitchen losartan (COZAAR) 50 MG tablet TAKE 1 TABLET BY MOUTH EVERY DAY  . metFORMIN (GLUCOPHAGE) 1000 MG tablet Take 500 mg by mouth 2 (two) times daily with a meal.   . metoprolol tartrate (LOPRESSOR) 50  MG tablet Take 1 tablet (50 mg total) by mouth 2 (two) times daily.  . nitroGLYCERIN (NITROSTAT) 0.4 MG SL tablet Place 1 tablet (0.4 mg total) under the tongue every 5 (five) minutes as needed for chest pain.  . Omega-3 Fatty Acids (FISH OIL) 1000 MG CAPS Take 2,000 mg by mouth 4 (four) times a week.   . polyethylene glycol (MIRALAX / GLYCOLAX) packet Take 17 g by mouth 2 (two) times daily.  . ranolazine (RANEXA) 500 MG 12 hr tablet Take 1 tablet (500 mg total) by mouth 2 (two) times daily.  Marland Kitchen spironolactone (ALDACTONE) 25 MG tablet TAKE 1 TABLET BY MOUTH EVERY DAY  . XARELTO 2.5 MG TABS tablet TAKE 1 TABLET BY MOUTH TWICE A DAY     Allergies:   Niacin and Oxycodone   Social History   Socioeconomic History  . Marital status: Married    Spouse name: Not on file  . Number of children: Not on file  . Years  of education: Not on file  . Highest education level: Not on file  Occupational History  . Not on file  Social Needs  . Financial resource strain: Not on file  . Food insecurity:    Worry: Not on file    Inability: Not on file  . Transportation needs:    Medical: Not on file    Non-medical: Not on file  Tobacco Use  . Smoking status: Current Every Day Smoker    Packs/day: 0.50    Years: 58.00    Pack years: 29.00    Types: Cigarettes  . Smokeless tobacco: Never Used  Substance and Sexual Activity  . Alcohol use: Yes    Comment: occasional  . Drug use: No  . Sexual activity: Not on file  Lifestyle  . Physical activity:    Days per week: Not on file    Minutes per session: Not on file  . Stress: Not on file  Relationships  . Social connections:    Talks on phone: Not on file    Gets together: Not on file    Attends religious service: Not on file    Active member of club or organization: Not on file    Attends meetings of clubs or organizations: Not on file    Relationship status: Not on file  Other Topics Concern  . Not on file  Social History Narrative  . Not on file     Family History: The patient's family history includes Cancer in his father; Diabetes in his mother; Heart disease in his maternal grandfather; Hypertension in his mother and sister. ROS:   Please see the history of present illness.    All 14 point review of systems negative except as described per history of present illness  EKGs/Labs/Other Studies Reviewed:      Recent Labs: No results found for requested labs within last 8760 hours.  Recent Lipid Panel No results found for: CHOL, TRIG, HDL, CHOLHDL, VLDL, LDLCALC, LDLDIRECT  Physical Exam:    VS:  BP (!) 100/48   Pulse 73   Ht 5\' 8"  (1.727 m)   Wt 232 lb 3.2 oz (105.3 kg)   SpO2 98%   BMI 35.31 kg/m     Wt Readings from Last 3 Encounters:  03/14/18 232 lb 3.2 oz (105.3 kg)  03/08/18 243 lb (110.2 kg)  11/11/17 243 lb (110.2 kg)       GEN:  Well nourished, well developed in no acute distress HEENT: Normal NECK:  No JVD; No carotid bruits LYMPHATICS: No lymphadenopathy CARDIAC: RRR, no murmurs, no rubs, no gallops RESPIRATORY:  Clear to auscultation without rales, wheezing or rhonchi  ABDOMEN: Soft, non-tender, non-distended MUSCULOSKELETAL:  No edema; No deformity  SKIN: Warm and dry LOWER EXTREMITIES: 2+ swelling NEUROLOGIC:  Alert and oriented x 3 PSYCHIATRIC:  Normal affect   ASSESSMENT:    1. Coronary artery disease involving native coronary artery of native heart without angina pectoris   2. Dilated cardiomyopathy ([place])   3. Essential hypertension   4. Type 2 diabetes mellitus with diabetic peripheral angiopathy without gangrene, without long-term current use of insulin ([place])   5. Mixed hyperlipidemia   6. History of coronary artery bypass graft    PLAN:    In order of problems listed above:  1. Coronary disease minimal defect involving the basal portion of the inferior wall medical therapy for now which I will continue. 2. Dilated cardiomyopathy on appropriate medication which I will continue. 3. Type 2 diabetes followed by internal medicine team. 4. Essential hypertension blood pressure well controlled continue present management. 5. Dyslipidemia continue high intensity statin. 6. Metastatic cancer source unknown investigation being done.  Most likely coming from the prostate 7. Swelling of lower extremities in spite of the fact he takes small dose of Xarelto I think it would be reasonable to perform DVT study make sure he does not have any deep venous thrombosis.   Medication Adjustments/Labs and Tests Ordered: Current medicines are reviewed at length with the patient today.  Concerns regarding medicines are outlined above.  No orders of the defined types were placed in this encounter.  Medication changes: No orders of the defined types were placed in this encounter.   Signed, Park Liter, MD, [facility] 03/14/2018 2:07 PM    [place]

## 2018-03-14 NOTE — Patient Instructions (Signed)
Medication Instructions:  Your physician recommends that you continue on your current medications as directed. Please refer to the Current Medication list given to you today.  If you need a refill on your cardiac medications before your next appointment, please call your pharmacy.   Lab work: None.  If you have labs (blood work) drawn today and your tests are completely normal, you will receive your results only by: Marland Kitchen MyChart Message (if you have MyChart) OR . A paper copy in the mail If you have any lab test that is abnormal or we need to change your treatment, we will call you to review the results.  Testing/Procedures: Your physician has requested that you have a lower or upper extremity venous duplex. This test is an ultrasound of the veins in the legs or arms. It looks at venous blood flow that carries blood from the heart to the legs or arms. Allow one hour for a Lower Venous exam. Allow thirty minutes for an Upper Venous exam. There are no restrictions or special instructions.     Follow-Up: At [facility], you and your health needs are our priority.  As part of our continuing mission to provide you with exceptional heart care, we have created designated Provider Care Teams.  These Care Teams include your primary Cardiologist (physician) and Advanced Practice Providers (APPs -  Physician Assistants and Nurse Practitioners) who all work together to provide you with the care you need, when you need it. You will need a follow up appointment in 3 months.  Please call our office 2 months in advance to schedule this appointment.  You may see No primary care provider on file. or another member of our [employer] Provider Team in [place]: Shirlee More, MD . Jyl Heinz, MD  Any Other Special Instructions Will Be Listed Below (If Applicable).

## 2018-03-24 DIAGNOSIS — I5030 Unspecified diastolic (congestive) heart failure: Secondary | ICD-10-CM | POA: Diagnosis not present

## 2018-03-24 DIAGNOSIS — I110 Hypertensive heart disease with heart failure: Secondary | ICD-10-CM | POA: Diagnosis not present

## 2018-03-24 DIAGNOSIS — L89152 Pressure ulcer of sacral region, stage 2: Secondary | ICD-10-CM | POA: Diagnosis not present

## 2018-03-24 DIAGNOSIS — E119 Type 2 diabetes mellitus without complications: Secondary | ICD-10-CM | POA: Diagnosis not present

## 2018-03-30 DIAGNOSIS — C772 Secondary and unspecified malignant neoplasm of intra-abdominal lymph nodes: Secondary | ICD-10-CM | POA: Diagnosis not present

## 2018-03-30 DIAGNOSIS — C801 Malignant (primary) neoplasm, unspecified: Secondary | ICD-10-CM | POA: Diagnosis not present

## 2018-03-30 DIAGNOSIS — C775 Secondary and unspecified malignant neoplasm of intrapelvic lymph nodes: Secondary | ICD-10-CM | POA: Diagnosis not present

## 2018-03-30 DIAGNOSIS — I469 Cardiac arrest, cause unspecified: Secondary | ICD-10-CM | POA: Diagnosis not present

## 2018-03-30 DIAGNOSIS — C7951 Secondary malignant neoplasm of bone: Secondary | ICD-10-CM | POA: Diagnosis not present

## 2018-04-23 DEATH — deceased

## 2018-09-09 IMAGING — DX DG PORTABLE PELVIS
1 series · 1 of 1 positions shown · non-contrast
Comparison: 01/09/2017.

CLINICAL DATA: Hip replacement.

EXAM:
PORTABLE PELVIS 1-2 VIEWS

[pelvis ap]
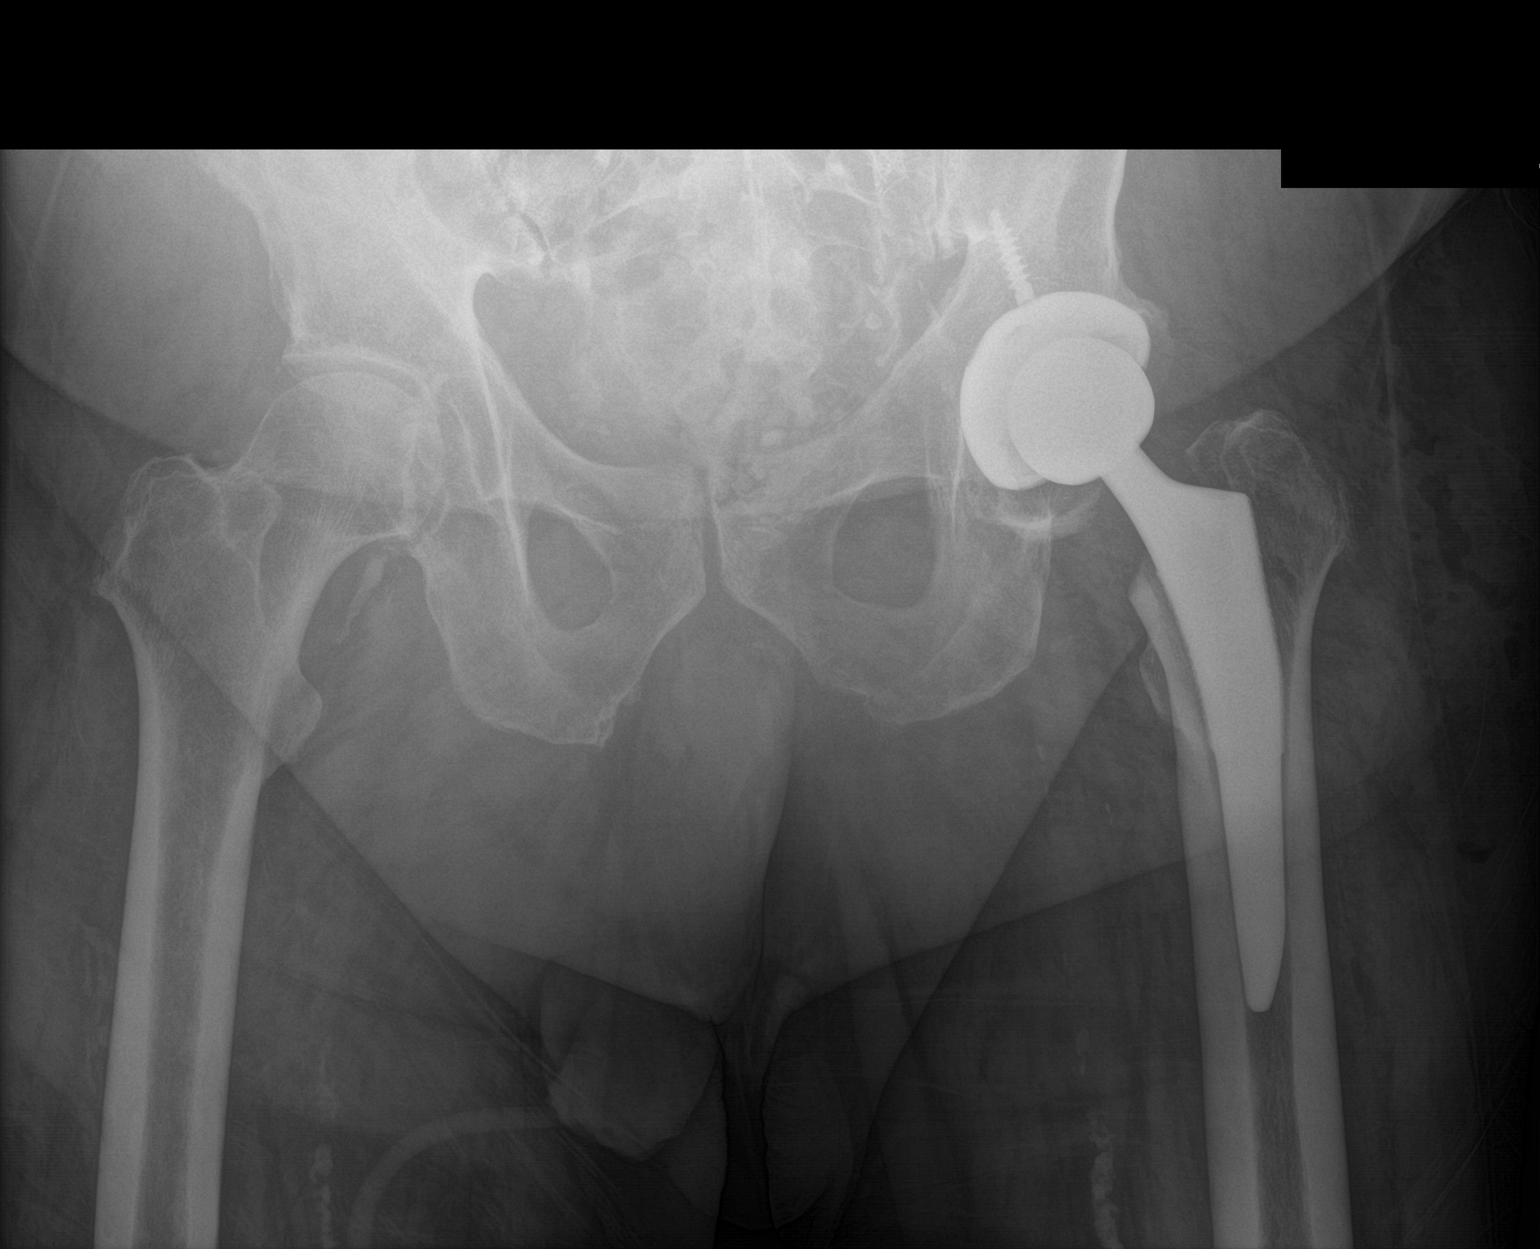

[1 of 1 positions shown; findings below may reference images not displayed]

FINDINGS: Postop changes of total left hip replacement. Hardware intact.
Anatomic alignment. Peripheral vascular calcification .
IMPRESSION: Total left hip replacement with anatomic alignment.
